# Patient Record
Sex: Female | Born: 1965 | Race: White | Hispanic: No | Marital: Married | Smoking: Never smoker
Health system: Southern US, Community
[De-identification: ages and names within clinical notes are randomized; demographics above are authoritative.]

## PROBLEM LIST (undated history)

## (undated) DIAGNOSIS — Z923 Personal history of irradiation: Secondary | ICD-10-CM

## (undated) DIAGNOSIS — Z9221 Personal history of antineoplastic chemotherapy: Secondary | ICD-10-CM

## (undated) DIAGNOSIS — J45909 Unspecified asthma, uncomplicated: Secondary | ICD-10-CM

## (undated) DIAGNOSIS — Z8489 Family history of other specified conditions: Secondary | ICD-10-CM

## (undated) DIAGNOSIS — C50919 Malignant neoplasm of unspecified site of unspecified female breast: Secondary | ICD-10-CM

## (undated) DIAGNOSIS — F32A Depression, unspecified: Secondary | ICD-10-CM

## (undated) HISTORY — DX: Unspecified asthma, uncomplicated: J45.909

## (undated) HISTORY — DX: Malignant neoplasm of unspecified site of unspecified female breast: C50.919

## (undated) HISTORY — DX: Depression, unspecified: F32.A

---

## 2004-01-18 ENCOUNTER — Other Ambulatory Visit: Admission: RE | Admit: 2004-01-18 | Discharge: 2004-01-18 | Payer: Self-pay | Admitting: Obstetrics and Gynecology

## 2004-08-31 ENCOUNTER — Encounter (INDEPENDENT_AMBULATORY_CARE_PROVIDER_SITE_OTHER): Payer: Self-pay | Admitting: *Deleted

## 2004-08-31 ENCOUNTER — Ambulatory Visit (HOSPITAL_COMMUNITY): Admission: RE | Admit: 2004-08-31 | Discharge: 2004-08-31 | Payer: Self-pay | Admitting: Obstetrics and Gynecology

## 2005-02-14 ENCOUNTER — Other Ambulatory Visit: Admission: RE | Admit: 2005-02-14 | Discharge: 2005-02-14 | Payer: Self-pay | Admitting: Obstetrics and Gynecology

## 2006-05-04 ENCOUNTER — Inpatient Hospital Stay (HOSPITAL_COMMUNITY): Admission: EM | Admit: 2006-05-04 | Discharge: 2006-05-06 | Payer: Self-pay | Admitting: *Deleted

## 2006-05-05 ENCOUNTER — Ambulatory Visit: Payer: Self-pay | Admitting: Psychiatry

## 2006-05-12 ENCOUNTER — Other Ambulatory Visit (HOSPITAL_COMMUNITY): Admission: RE | Admit: 2006-05-12 | Discharge: 2006-08-10 | Payer: Self-pay | Admitting: Psychiatry

## 2010-10-04 ENCOUNTER — Encounter
Admission: RE | Admit: 2010-10-04 | Discharge: 2010-10-04 | Payer: Self-pay | Source: Home / Self Care | Attending: Obstetrics and Gynecology | Admitting: Obstetrics and Gynecology

## 2011-03-01 NOTE — Op Note (Signed)
NAMECHINWE, LOPE   ACCOUNT NO.:  [id number]   MEDICAL RECORD NO.:  [id number]          PATIENT TYPE:  AMB   LOCATION:  SDC                           [facility]ILITY:  [facility]   PHYSICIAN:  Dineen Kid. Rana Snare, M.D.    DATE OF BIRTH:  01-04-1966   DATE OF PROCEDURE:  DATE OF DISCHARGE:                                 OPERATIVE REPORT   Audio too short to transcribe (less than 5 seconds)      DCL/MEDQ  D:  08/31/2004  T:  08/31/2004  Job:  [id number]

## 2011-03-01 NOTE — Op Note (Signed)
NAMEANNABELL, [name]   ACCOUNT NO.:  [id number]   MEDICAL RECORD NO.:  [id number]          PATIENT TYPE:  AMB   LOCATION:  SDC                           [facility]ILITY:  [facility]   PHYSICIAN:  Dineen Kid. Rana Snare, M.D.    DATE OF BIRTH:  Nov 09, 1965   DATE OF PROCEDURE:  08/31/2004  DATE OF DISCHARGE:                                 OPERATIVE REPORT   PREOPERATIVE DIAGNOSIS:  Abnormal uterine bleeding and endometrial mass on  sonohysterogram.   POSTOPERATIVE DIAGNOSIS:  Abnormal uterine bleeding and endometrial mass on  sonohysterogram.   PROCEDURE:  Hysteroscopy with dilatation and curettage.   SURGEON:  Dineen Kid. Rana Snare, M.D.   ANESTHESIA:  General by LMA.   INDICATIONS:  Ms. Jaime Pearson is a 45 year old nulligravida with abnormal  uterine bleeding.  Underwent a sonohysterogram showing irregular anterior  surface and several fibroids.  She presents for a more definitive surgical  evaluation and treatment of this.  Planned hysteroscopy with D&C, possible  resectoscope.  Risks and benefits were discussed at length, which include  but are not limited to risk of infection, bleeding; damage to uterus, tubes,  ovaries, gallbladder; the risk that this might not alleviate the bleeding,  worsen the bleeding, or further surgery needed in the future for this.  The  risks associated with anesthesia and blood loss were also discussed, and  informed consent was obtained.  Findings at the time of the surgery were a  thickened anterior endometrial lining with a question of early polyps.  Posterior endometrial lining appeared to be normal as did the ostia and the  cervix.   DESCRIPTION OF PROCEDURE:  After adequate analgesia, the patient was placed  in the dorsal lithotomy position.  She was sterilely prepped and draped.  The bladder was thoroughly drained.  A __________  speculum was placed.  Tenaculum was placed in the anterior lip of the cervix.  Uterus was sounded  to 8 cm, easily dilated to a #31  Pratt dilator.  Hysteroscope was inserted.  The above findings were noted.  Curettage was performed, retrieving a  moderate amount of endometrial tissue and clot.  This was followed by  reexamination with the hysteroscope, revealing a fairly normal-appearing  endometrial cavity.  Resectoscope was used to remove a small fragment of  endometrium from the anterior left endometrial wall.  With good hemostasis  achieved, normal-appearing endometrial cavity at this point with a normal-  appearing ostia and cervix, the hysteroscope was then removed.  Tenaculum  removed from the cervix.  Noted to be hemostatic.  The speculum was then  removed, and patient transferred to the recovery room in stable condition.  The estimated blood loss less than 10 cc.  __________ deficit 100 cubic  centimeters.  The patient received Rocephin 1 g preoperatively and 30 mg  Toradol IV postoperatively.   DISPOSITION:  The patient will be discharged home to follow up in the office  in 2 to 3 weeks and with a routine instruction sheet for D&C.  Told to  return for increased pain, fever, or bleeding.    DCL/MEDQ  D:  08/31/2004  T:  09/01/2004  Job:  [id number]

## 2011-03-01 NOTE — Discharge Summary (Signed)
Jaime Pearson, Jaime Pearson   ACCOUNT NO.:  [id number]   MEDICAL RECORD NO.:  [id number]          PATIENT TYPE:  IPS   LOCATION:  0508                          [facility]   PHYSICIAN:  Jasmine Pang, M.D. DATE OF BIRTH:  Feb 11, 1966   DATE OF ADMISSION:  05/04/2006  DATE OF DISCHARGE:  05/06/2006                                 DISCHARGE SUMMARY   IDENTIFYING INFORMATION:  A 45 year old married Caucasian female who was  admitted on a voluntary basis to my service on May 04, 2006.   HISTORY OF PRESENT ILLNESS:  The patient has a history of depression, this  has escalated and she has begun to feel overwhelmed. She feels particularly  guilty over an affair she has had with a married man. She states her husband  is now aware and they are trying to deal with this and their marriage. The  patient has been having episodes of crying, having fleeting suicidal  thoughts, no specific plan. She stated there was a lot of stress at home  right now because of the affair. She states she would have episodes of  crying to the point of being hysterical. She stated that she was especially  devastated about the end of this affair because this person had been a close  friend for many years and she has lost that as well. She is grieving this  loss.   PAST PSYCHIATRIC HISTORY:  This is the first [facility] hospitalization. She sees  Dr. Leilani Merl at [facility]. She also sees Dema Severin for therapy.   FAMILY HISTORY:  Positive for father being an alcoholic, father abusing  alcohol.   SUBSTANCE ABUSE HISTORY:  The patient is a nonsmoker. She denies alcohol or  drug use.   PAST MEDICAL HISTORY:   MEDICAL PROBLEMS:  The patient is healthy.   MEDICATIONS:  Wellbutrin XL 300 mg p.o. daily x2 weeks.   ALLERGIES:  No known drug allergies.   PHYSICAL EXAMINATION:  The patient's physical exam was done by our nurse  practitioner.  She was found to be a healthy, middle-aged female without acute physical  distress.   ADMISSION LABS:  TSH was 1.128 which was within normal limits. Urinalysis  revealed 11-20 WBCs per HPF with many bacterium. The patient possibly has a  UTI, we will look into this further. Direct bilirubin was less than 0.1.  Urine pregnancy test was negative. Comprehensive metabolic panel within  normal limits except for an elevated glucose of 107. Hepatic profile within  normal limits. CBC was within normal limits.   HOSPITAL COURSE:  Upon admission, the patient was started on Ambien 10 mg  p.o. q.h.s., may repeat x1 and Wellbutrin XL 300 mg p.o. daily. On 2007 23,  2007, the patient was started ibuprofen 400 mg now for cramping pain and  q.6h. for cramping pain. The patient tolerated her medications well with no  significant side effects. She discussed the stress at home. She felt more  under control, she was not hysterical anymore. She states that she had had  some thoughts of wanting to die but no longer had these. She did discuss the  grief over the loss of her  close friend who she had an affair with and they  had to break it off. The patient was feeling that intensive outpatient  program would be a better fit for her as she was not suicidal and homicidal  or psychotic.   On May 06, 2006, the patient's mental status had improved. She had good eye  contact, speech was normal, rate and flow. Psychomotor activity was normal.  Mood wise there was still some depression and anxiety but this was  improving. Affect wide range, smiling at times. No suicidal or homicidal  ideations. No self injurious behavior, no auditory or visual hallucinations.  No paranoia or delusions, soft logical, goal directed, thought content no  predominant theme. Cognitive is grossly intact. The patient was agreeable to  IOP for further treatment. She had talked with her husband and he felt too  that she would be safe to come home.   DISCHARGE DIAGNOSES:  Major depression, single episode, severe  without  psychosis.  AXIS II:  None.  AXIS III:  Healthy.  AXIS IV:  Severe (recent affair, problems with primary support group, other  psychosocial problems).  AXIS V:  GAF upon discharge was 48. GAF upon admission was 35. GAF high this  past year was 85.   PLAN:  There were no specific activity levels of dietary restrictions. She  was discharged on bupropion XL 300 mt daily and Ambien 10 mg at bedtime. May  repeat dose if needed.   POST HOSPITAL CARE PLANS:  The patient will attend our intensive outpatient  program. She will also continue with her counselor at [facility].      Jasmine Pang, M.D.  Electronically Signed     BHS/MEDQ  D:  05/06/2006  T:  05/06/2006  Job:  [id number]

## 2011-09-27 IMAGING — US US BREAST RIGHT
1 series · 12 of 12 positions shown · non-contrast
Comparison: Screening mammogram dated 09/12/2010.

CLINICAL DATA: Possible right breast mass at recent screening
mammography.

DIGITAL DIAGNOSTIC RIGHT MAMMOGRAM  AND RIGHT BREAST ULTRASOUND:

[Series 1: us breast right · 12 of 12 slices shown]
[im 1/12]
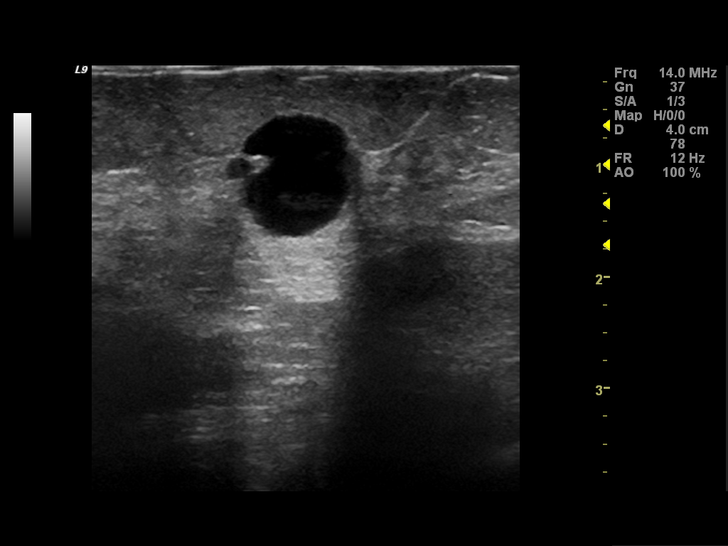
[im 2/12]
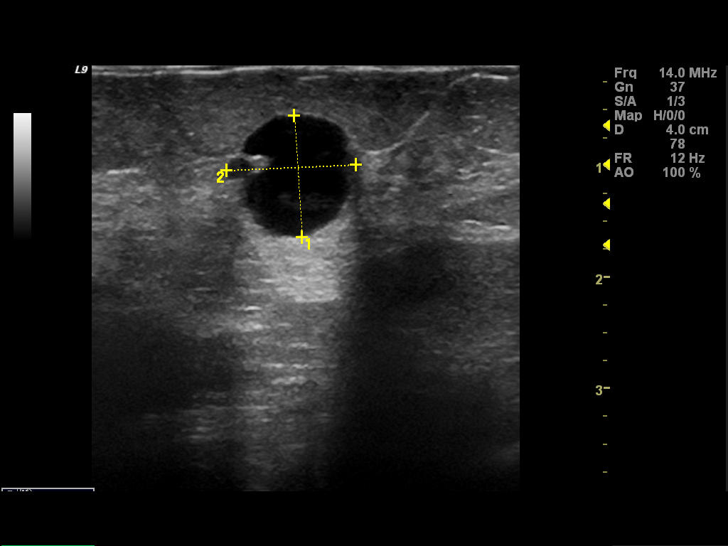
[im 3/12]
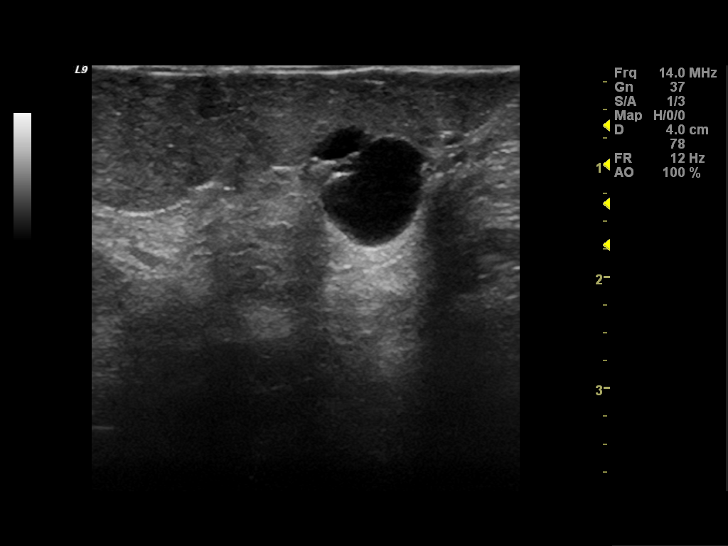
[im 4/12]
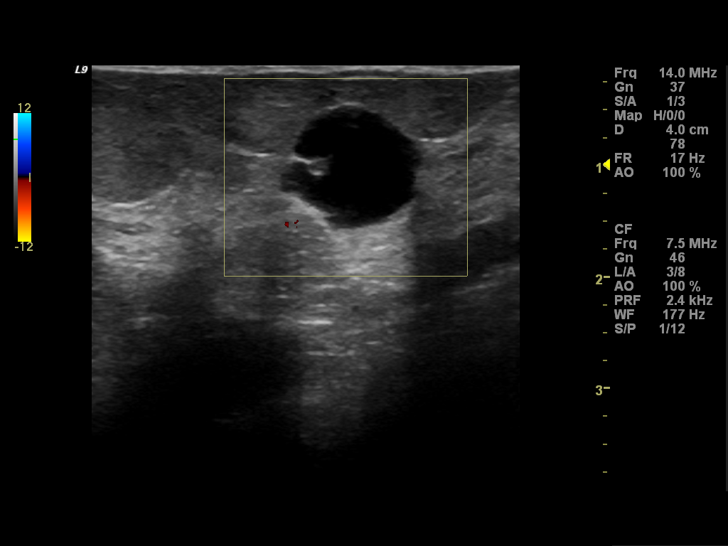
[im 5/12]
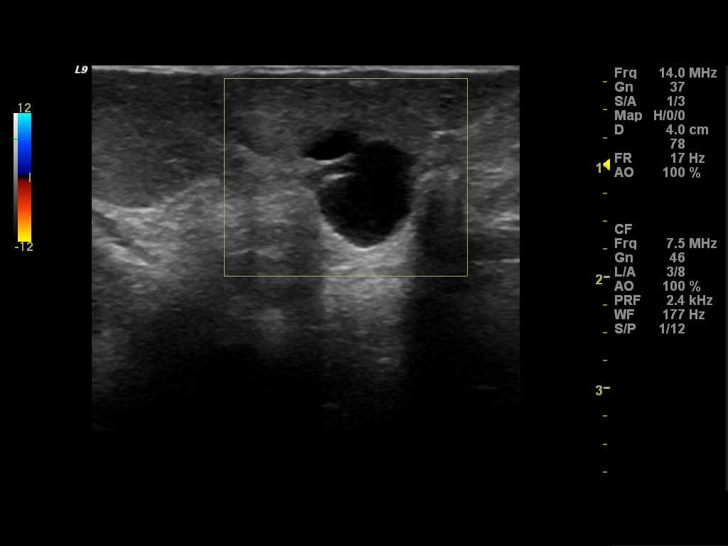
[im 6/12]
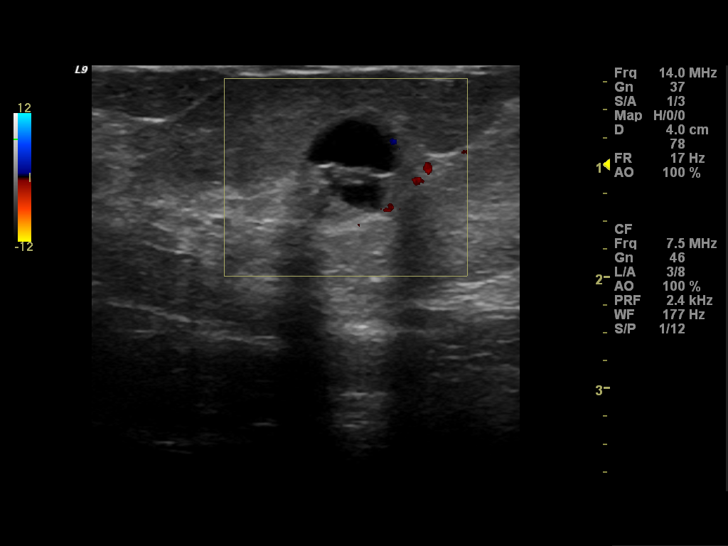
[im 7/12]
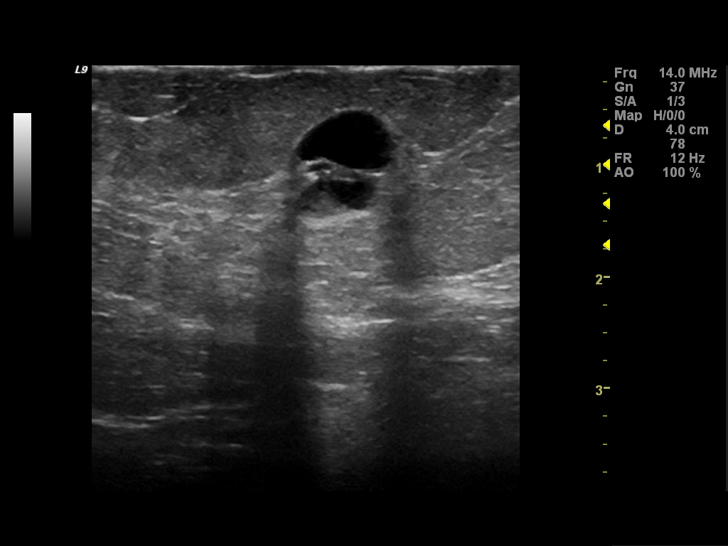
[im 8/12]
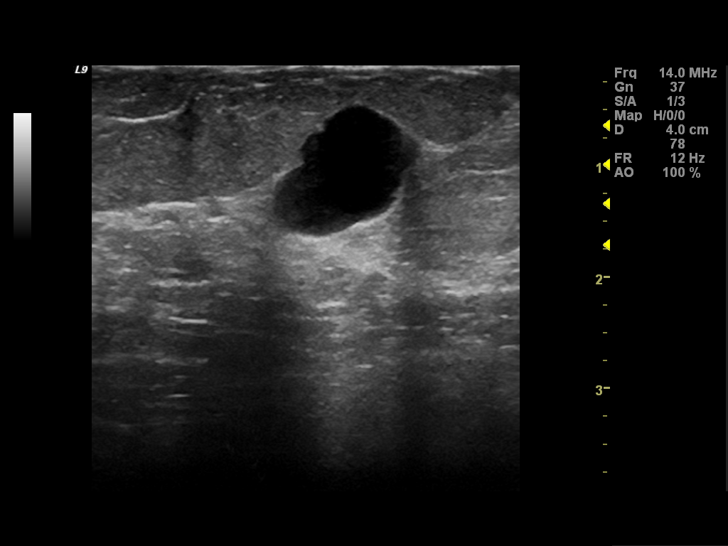
[im 9/12]
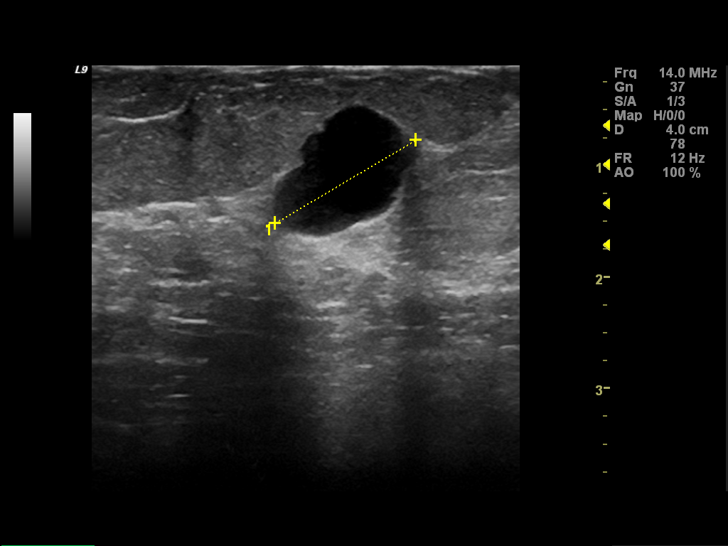
[im 10/12]
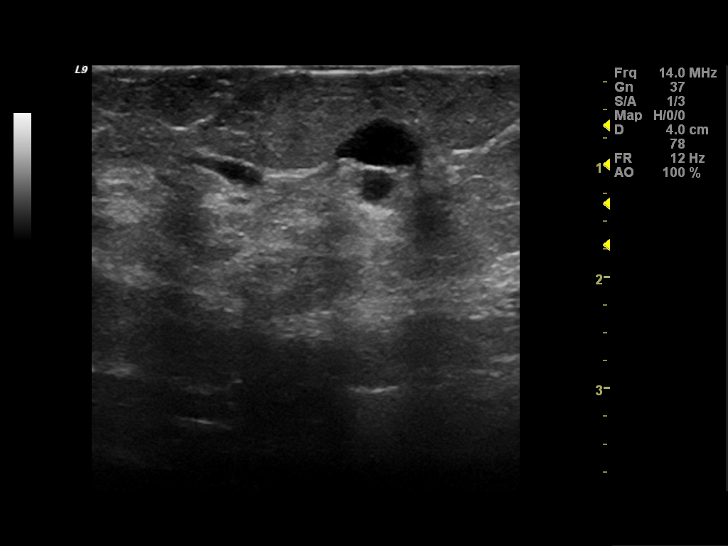
[im 11/12]
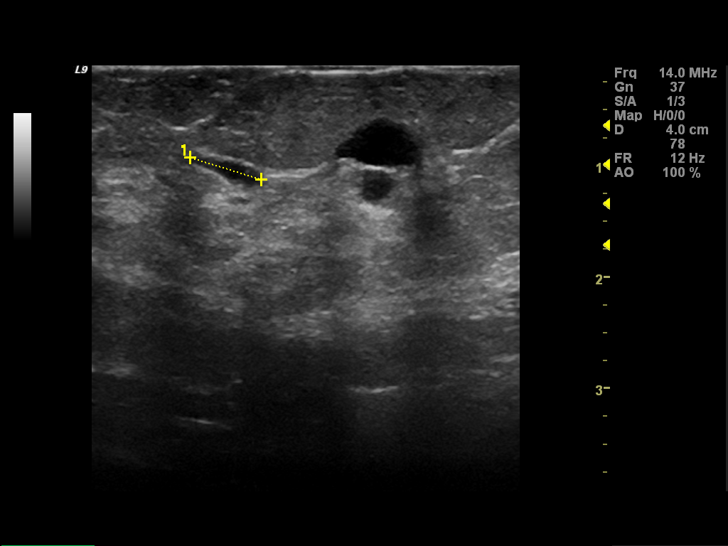
[im 12/12]
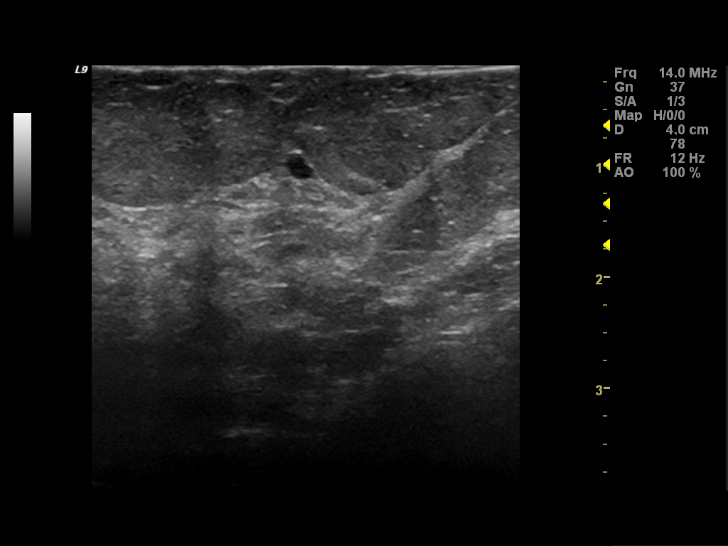

[12 of 12 positions shown; findings below may reference images not displayed]

FINDINGS: Spot compression views of the right breast confirm a
rounded mass in the lower outer portion of the breast, anteriorly.
The margins are partially obscured.  The visualized margins are
smooth.

On physical exam, no mass is palpable in the inferior right breast

Ultrasound is performed, showing a 1.5 cm mildly macrolobulated
cyst containing a small daughter cyst in the 7 o'clock position of
the right breast, 6 cm from the nipple, corresponding to the
mammographic mass.  This has a moderately thickened internal
septation without nodularity and with no internal blood flow with
color Doppler.  There is an adjacent 7 mm cyst as well.
IMPRESSION: Right breast cysts.  No evidence of malignancy.  Screening
mammography is recommended in 1 year.

BI-RADS CATEGORY 2:  Benign finding(s).

## 2015-06-05 ENCOUNTER — Other Ambulatory Visit: Payer: Self-pay | Admitting: Obstetrics and Gynecology

## 2015-06-07 LAB — CYTOLOGY - PAP

## 2022-05-02 ENCOUNTER — Other Ambulatory Visit: Payer: Self-pay | Admitting: Obstetrics and Gynecology

## 2022-05-02 DIAGNOSIS — R928 Other abnormal and inconclusive findings on diagnostic imaging of breast: Secondary | ICD-10-CM

## 2022-05-08 ENCOUNTER — Ambulatory Visit
Admission: RE | Admit: 2022-05-08 | Discharge: 2022-05-08 | Disposition: A | Discharge status: Home / Self Care | Payer: Self-pay | Source: Ambulatory Visit | Attending: Obstetrics and Gynecology | Admitting: Obstetrics and Gynecology

## 2022-05-08 ENCOUNTER — Ambulatory Visit
Admission: RE | Admit: 2022-05-08 | Discharge: 2022-05-08 | Disposition: A | Discharge status: Home / Self Care | Payer: BC Managed Care – PPO | Source: Ambulatory Visit | Attending: Obstetrics and Gynecology | Admitting: Obstetrics and Gynecology

## 2022-05-08 ENCOUNTER — Other Ambulatory Visit: Payer: Self-pay | Admitting: Obstetrics and Gynecology

## 2022-05-08 DIAGNOSIS — N6321 Unspecified lump in the left breast, upper outer quadrant: Secondary | ICD-10-CM

## 2022-05-08 DIAGNOSIS — R928 Other abnormal and inconclusive findings on diagnostic imaging of breast: Secondary | ICD-10-CM

## 2022-05-13 ENCOUNTER — Ambulatory Visit
Admission: RE | Admit: 2022-05-13 | Discharge: 2022-05-13 | Disposition: A | Discharge status: Home / Self Care | Payer: BC Managed Care – PPO | Source: Ambulatory Visit | Attending: Obstetrics and Gynecology | Admitting: Obstetrics and Gynecology

## 2022-05-13 DIAGNOSIS — N6321 Unspecified lump in the left breast, upper outer quadrant: Secondary | ICD-10-CM

## 2022-05-16 ENCOUNTER — Telehealth: Payer: Self-pay | Admitting: Hematology and Oncology

## 2022-05-16 NOTE — Telephone Encounter (Signed)
Spoke to patient to confirm afternoon clinic appointment for 8/9, sent paperwork via e-mail

## 2022-05-17 ENCOUNTER — Encounter: Payer: Self-pay | Admitting: *Deleted

## 2022-05-17 DIAGNOSIS — C50412 Malignant neoplasm of upper-outer quadrant of left female breast: Secondary | ICD-10-CM | POA: Insufficient documentation

## 2022-05-17 DIAGNOSIS — Z170 Estrogen receptor positive status [ER+]: Secondary | ICD-10-CM

## 2022-05-21 NOTE — Progress Notes (Signed)
Radiation Oncology         [phone] ________________________________  Multidisciplinary Breast Oncology Clinic [facility]) Initial Outpatient Consultation  Name: Jaime Pearson MRN: [id number]  Date: 05/22/2022  DOB: 1966/03/24  CC:No primary care provider on file.  Stark Klein, MD   REFERRING PHYSICIAN: Stark Klein, MD  DIAGNOSIS: There were no encounter diagnoses.  Stage *** Left Breast UOQ, Invasive Ductal Carcinoma, ER+ / PR+ / Her2***, Grade 3  No diagnosis found.  HISTORY OF PRESENT ILLNESS::Jaime Pearson is a 56 y.o. female who is presenting to the office today for evaluation of her newly diagnosed breast cancer. She is accompanied by ***. She is doing well overall.   She had routine screening mammography on 04/30/22 showing a possible abnormality in the left breast. She underwent left breast diagnostic mammography with tomography and left breast ultrasonography at [facility] on 05/08/22 showing: a highly suspicious mass in the 1 o'clock left breast, 7 cmfn, measuring 2.8 x 1.8 x 2.9 cm. No axillary adenopathy was appreciated.   Biopsy of the left breast mass on 05/13/22 showed: grade 3 invasive ductal carcinoma measuring 1.3 cm in the greatest linear extent . Prognostic indicators significant for: estrogen receptor, 95% positive with strong staining intensity and progesterone receptor, 20% positive with moderate staining intensity. Proliferation marker Ki67 at 40%. HER2 {positive or negative}.  Menarche: *** years old Age at first live birth: *** years old GP: *** LMP: *** Contraceptive: *** HRT: ***   The patient was referred today for presentation in the multidisciplinary conference.  Radiology studies and pathology slides were presented there for review and discussion of treatment options.  A consensus was discussed regarding potential next steps.  PREVIOUS RADIATION THERAPY: {EXAM; YES/NO:19492::"No"}  PAST MEDICAL HISTORY: No past  medical history on file.  PAST SURGICAL HISTORY:*** The histories are not reviewed yet. Please review them in the "History" navigator section and refresh this [place].  FAMILY HISTORY:  Family History  Problem Relation Age of Onset   Breast cancer Sister 57    SOCIAL HISTORY:  Social History   Socioeconomic History   Marital status: Married    Spouse name: Not on file   Number of children: Not on file   Years of education: Not on file   Highest education level: Not on file  Occupational History   Not on file  Tobacco Use   Smoking status: Not on file   Smokeless tobacco: Not on file  Substance and Sexual Activity   Alcohol use: Not on file   Drug use: Not on file   Sexual activity: Not on file  Other Topics Concern   Not on file  Social History Narrative   Not on file   Social Determinants of Health   Financial Resource Strain: Not on file  Food Insecurity: Not on file  Transportation Needs: Not on file  Physical Activity: Not on file  Stress: Not on file  Social Connections: Not on file    ALLERGIES: Not on File  MEDICATIONS:  No current outpatient medications on file.   No current facility-administered medications for this encounter.    REVIEW OF SYSTEMS: A 10+ POINT REVIEW OF SYSTEMS WAS OBTAINED including neurology, dermatology, psychiatry, cardiac, respiratory, lymph, extremities, GI, GU, musculoskeletal, constitutional, reproductive, HEENT. On the provided form, she reports ***. She denies *** and any other symptoms.    PHYSICAL EXAM:  vitals were not taken for this visit.  {may need to copy over vitals} Lungs are clear to auscultation bilaterally.  Heart has regular rate and rhythm. No palpable cervical, supraclavicular, or axillary adenopathy. Abdomen soft, non-tender, normal bowel sounds. Breast: *** breast with no palpable mass, nipple discharge, or bleeding. *** breast with ***.   KPS = ***  100 - Normal; no complaints; no evidence of  disease. 90   - Able to carry on normal activity; minor signs or symptoms of disease. 80   - Normal activity with effort; some signs or symptoms of disease. 70   - Cares for self; unable to carry on normal activity or to do active work. 60   - Requires occasional assistance, but is able to care for most of his personal needs. 50   - Requires considerable assistance and frequent medical care. 8   - Disabled; requires special care and assistance. 64   - Severely disabled; hospital admission is indicated although death not imminent. 53   - Very sick; hospital admission necessary; active supportive treatment necessary. 10   - Moribund; fatal processes progressing rapidly. 0     - Dead  Karnofsky DA, Abelmann WH, Craver LS and Burchenal JH [phone]) The use of the nitrogen mustards in the palliative treatment of carcinoma: with particular reference to bronchogenic carcinoma Cancer 1 [phone]  LABORATORY DATA:  No results found for: "WBC", "HGB", "HCT", "MCV", "PLT" No results found for: "NA", "K", "CL", "CO2" No results found for: "ALT", "AST", "GGT", "ALKPHOS", "BILITOT"  PULMONARY FUNCTION TEST:   Review Flowsheet        No data to display          RADIOGRAPHY: [place] LT BREAST BX W LOC DEV 1ST LESION IMG BX SPEC US GUIDE  Addendum Date: 05/16/2022   ADDENDUM REPORT: 05/16/2022 13:24 ADDENDUM: Pathology revealed GRADE III INVASIVE DUCTAL CARCINOMA of the LEFT breast, 1:00 o'clock, (ribbon clip). This was found to be concordant by Dr. Claudie Revering. Pathology results were discussed with the patient and her husband by telephone. The patient reported doing well after the biopsy with tenderness at the site. Post biopsy instructions and care were reviewed and questions were answered. The patient was encouraged to call [facility] for any additional concerns. My direct phone number was provided. The patient was referred to [facility] at  [facility] on May 22, 2022. Pathology results reported by Terie Purser, RN on 05/16/2022. Electronically Signed   By: Claudie Revering M.D.   On: 05/16/2022 13:24   Result Date: 05/16/2022 CLINICAL DATA:  2.9 cm mass in the 1 o'clock position of the left breast with recent mammogram and ultrasound features highly suspicious for malignancy. EXAM: ULTRASOUND GUIDED LEFT BREAST CORE NEEDLE BIOPSY COMPARISON:  Previous exam(s). PROCEDURE: I met with the patient and we discussed the procedure of ultrasound-guided biopsy, including benefits and alternatives. We discussed the high likelihood of a successful procedure. We discussed the risks of the procedure, including infection, bleeding, tissue injury, clip migration, and inadequate sampling. Informed written consent was given. The usual time-out protocol was performed immediately prior to the procedure. Lesion quadrant: Upper outer quadrant Using sterile technique and 1% Lidocaine as local anesthetic, under direct ultrasound visualization, a 12 gauge spring-loaded device was used to perform biopsy of the recently demonstrated 2.9 cm mass in the 1 o'clock position of the left breast, 7 cm from the, using an inferolateral approach. At the conclusion of the procedure a ribbon shaped tissue marker clip was deployed into the biopsy cavity. Follow up 2 view  mammogram was performed and dictated separately. IMPRESSION: Ultrasound guided biopsy of a 2.9 cm mass in the 1 o'clock position of the left breast. No apparent complications. Electronically Signed: By: Claudie Revering M.D. On: 05/13/2022 14:50  MM CLIP PLACEMENT LEFT  Result Date: 05/13/2022 CLINICAL DATA:  Status post ultrasound-guided core needle biopsy of a 2.9 cm mass in the 1 o'clock position of the left breast EXAM: 3D DIAGNOSTIC LEFT MAMMOGRAM POST ULTRASOUND BIOPSY COMPARISON:  Previous exam(s). FINDINGS: 3D Mammographic images were obtained following ultrasound guided biopsy of recently  demonstrated 2.9 cm mass in the 1 o'clock position of the left breast. The biopsy marking clip is in the biopsied mass. IMPRESSION: Appropriate positioning of the ribbon shaped biopsy marking clip at the site of biopsy in the biopsied mass in the 1 o'clock position of the left breast. Final Assessment: Post Procedure Mammograms for Marker Placement Electronically Signed   By: Claudie Revering M.D.   On: 05/13/2022 15:02  MM DIAG BREAST TOMO UNI LEFT  Result Date: 05/08/2022 CLINICAL DATA:  The patient was called back for a left breast mass. EXAM: DIGITAL DIAGNOSTIC UNILATERAL LEFT MAMMOGRAM WITH TOMOSYNTHESIS; ULTRASOUND LEFT BREAST LIMITED TECHNIQUE: Left digital diagnostic mammography and breast tomosynthesis was performed.; Targeted ultrasound examination of the left breast was performed. COMPARISON:  Previous exam(s). ACR Breast Density Category c: The breast tissue is heterogeneously dense, which may obscure small masses. FINDINGS: There is a suspicious mass in the slightly lateral superior left breast with associated spiculation. Targeted ultrasound is performed, showing an irregular mass containing calcifications in the left breast at 1 o'clock, 7 cm from the nipple measuring 2.8 x 1.8 x 2.9 cm. No axillary adenopathy. IMPRESSION: Highly suspicious left breast mass.  No adenopathy. RECOMMENDATION: Recommend ultrasound-guided biopsy of the left breast mass. I have discussed the findings and recommendations with the patient. If applicable, a reminder letter will be sent to the patient regarding the next appointment. BI-RADS CATEGORY  5: Highly suggestive of malignancy. Electronically Signed   By: Dorise Bullion III M.D.   On: 05/08/2022 11:49  US BREAST LTD UNI LEFT INC AXILLA  Result Date: 05/08/2022 CLINICAL DATA:  The patient was called back for a left breast mass. EXAM: DIGITAL DIAGNOSTIC UNILATERAL LEFT MAMMOGRAM WITH TOMOSYNTHESIS; ULTRASOUND LEFT BREAST LIMITED TECHNIQUE: Left digital diagnostic  mammography and breast tomosynthesis was performed.; Targeted ultrasound examination of the left breast was performed. COMPARISON:  Previous exam(s). ACR Breast Density Category c: The breast tissue is heterogeneously dense, which may obscure small masses. FINDINGS: There is a suspicious mass in the slightly lateral superior left breast with associated spiculation. Targeted ultrasound is performed, showing an irregular mass containing calcifications in the left breast at 1 o'clock, 7 cm from the nipple measuring 2.8 x 1.8 x 2.9 cm. No axillary adenopathy. IMPRESSION: Highly suspicious left breast mass.  No adenopathy. RECOMMENDATION: Recommend ultrasound-guided biopsy of the left breast mass. I have discussed the findings and recommendations with the patient. If applicable, a reminder letter will be sent to the patient regarding the next appointment. BI-RADS CATEGORY  5: Highly suggestive of malignancy. Electronically Signed   By: Dorise Bullion III M.D.   On: 05/08/2022 11:49     IMPRESSION: ***   Patient will be a good candidate for breast conservation with radiotherapy to the left breast. We discussed the general course of radiation, potential side effects, and toxicities with radiation and the patient is interested in this approach. ***   PLAN:  ***    ------------------------------------------------  Blair Promise, PhD, MD  This document serves as a record of services personally performed by Gery Pray, MD. It was created on his behalf by Roney Mans, a trained medical scribe. The creation of this record is based on the scribe's personal observations and the provider's statements to them. This document has been checked and approved by the attending provider.

## 2022-05-22 ENCOUNTER — Encounter: Payer: Self-pay | Admitting: General Practice

## 2022-05-22 ENCOUNTER — Encounter: Payer: Self-pay | Admitting: Emergency Medicine

## 2022-05-22 ENCOUNTER — Other Ambulatory Visit: Payer: Self-pay | Admitting: General Surgery

## 2022-05-22 ENCOUNTER — Ambulatory Visit
Admission: RE | Admit: 2022-05-22 | Discharge: 2022-05-22 | Disposition: A | Discharge status: Home / Self Care | Payer: BC Managed Care – PPO | Source: Ambulatory Visit | Attending: Radiation Oncology | Admitting: Radiation Oncology

## 2022-05-22 ENCOUNTER — Other Ambulatory Visit: Payer: Self-pay

## 2022-05-22 ENCOUNTER — Inpatient Hospital Stay: Payer: BC Managed Care – PPO

## 2022-05-22 ENCOUNTER — Encounter: Payer: Self-pay | Admitting: Physical Therapy

## 2022-05-22 ENCOUNTER — Ambulatory Visit: Payer: BC Managed Care – PPO | Attending: General Surgery | Admitting: Physical Therapy

## 2022-05-22 ENCOUNTER — Inpatient Hospital Stay: Payer: BC Managed Care – PPO | Attending: Hematology and Oncology | Admitting: Hematology and Oncology

## 2022-05-22 ENCOUNTER — Encounter: Payer: Self-pay | Admitting: *Deleted

## 2022-05-22 ENCOUNTER — Encounter: Payer: Self-pay | Admitting: Hematology and Oncology

## 2022-05-22 ENCOUNTER — Inpatient Hospital Stay (HOSPITAL_BASED_OUTPATIENT_CLINIC_OR_DEPARTMENT_OTHER): Payer: BC Managed Care – PPO | Admitting: Genetic Counselor

## 2022-05-22 DIAGNOSIS — R293 Abnormal posture: Secondary | ICD-10-CM | POA: Diagnosis present

## 2022-05-22 DIAGNOSIS — C50412 Malignant neoplasm of upper-outer quadrant of left female breast: Secondary | ICD-10-CM

## 2022-05-22 DIAGNOSIS — Z8042 Family history of malignant neoplasm of prostate: Secondary | ICD-10-CM

## 2022-05-22 DIAGNOSIS — Z803 Family history of malignant neoplasm of breast: Secondary | ICD-10-CM

## 2022-05-22 DIAGNOSIS — Z170 Estrogen receptor positive status [ER+]: Secondary | ICD-10-CM | POA: Insufficient documentation

## 2022-05-22 LAB — CMP (CANCER CENTER ONLY)
ALT: 13 U/L (ref 0–44)
AST: 15 U/L (ref 15–41)
Albumin: 4.3 g/dL (ref 3.5–5.0)
Alkaline Phosphatase: 88 U/L (ref 38–126)
Anion gap: 4 — ABNORMAL LOW (ref 5–15)
BUN: 12 mg/dL (ref 6–20)
CO2: 30 mmol/L (ref 22–32)
Calcium: 9.9 mg/dL (ref 8.9–10.3)
Chloride: 103 mmol/L (ref 98–111)
Creatinine: 0.61 mg/dL (ref 0.44–1.00)
GFR, Estimated: >60 mL/min (ref >60)
Glucose, Bld: 96 mg/dL (ref 70–99)
Potassium: 4.1 mmol/L (ref 3.5–5.1)
Sodium: 137 mmol/L (ref 135–145)
Total Bilirubin: 0.6 mg/dL (ref 0.3–1.2)
Total Protein: 7.2 g/dL (ref 6.5–8.1)

## 2022-05-22 LAB — CBC WITH DIFFERENTIAL (CANCER CENTER ONLY)
Abs Immature Granulocytes: 0.02 10*3/uL (ref 0.00–0.07)
Basophils Absolute: 0 10*3/uL (ref 0.0–0.1)
Basophils Relative: 0 %
Eosinophils Absolute: 0.1 10*3/uL (ref 0.0–0.5)
Eosinophils Relative: 1 %
HCT: 40.4 % (ref 36.0–46.0)
Hemoglobin: 13.6 g/dL (ref 12.0–15.0)
Immature Granulocytes: 0 %
Lymphocytes Relative: 23 %
Lymphs Abs: 1.2 10*3/uL (ref 0.7–4.0)
MCH: 30.9 pg (ref 26.0–34.0)
MCHC: 33.7 g/dL (ref 30.0–36.0)
MCV: 91.8 fL (ref 80.0–100.0)
Monocytes Absolute: 0.6 10*3/uL (ref 0.1–1.0)
Monocytes Relative: 11 %
Neutro Abs: 3.3 10*3/uL (ref 1.7–7.7)
Neutrophils Relative %: 65 %
Platelet Count: 210 10*3/uL (ref 150–400)
RBC: 4.4 MIL/uL (ref 3.87–5.11)
RDW: 13.2 % (ref 11.5–15.5)
WBC Count: 5.1 10*3/uL (ref 4.0–10.5)
nRBC: 0 % (ref 0.0–0.2)

## 2022-05-22 LAB — GENETIC SCREENING ORDER

## 2022-05-22 NOTE — Research (Signed)
[employer] 2021-05 - Specimen Collection Study to Evaluate Biomarkers in Subjects with Cancer   Patient Jaime Pearson was identified by Clinical Research Nurse as a potential candidate for the above listed study.  This Clinical Research Nurse met with Shakela Donati, [id number], on 05/22/22 in a manner and location that ensures patient privacy to discuss participation in the above listed research study.  Patient is Accompanied by spouse Gwyndolyn Saxon.  A copy of the informed consent document with embedded HIPAA language was provided to the patient.  Patient reads, speaks, and understands [place].   Patient was provided with the business card of this Nurse and encouraged to contact the research team with any questions.  Approximately 15 minutes were spent with the patient reviewing the informed consent documents.  Patient was provided the option of taking informed consent documents home to review and was encouraged to review at their convenience with their support network, including other care providers. Patient took the consent documents home to review.  Will f/u with patient in a few days to determine interest.  Wells Guiles 'Donell Sievert, RN, BSN Clinical Research Nurse I 05/22/22 3:39 PM

## 2022-05-22 NOTE — Progress Notes (Signed)
[facility] Psychosocial Distress Screening Spiritual Care  Met with Jaime Pearson and her husband in [facility] to introduce [facility] team/resources, reviewing distress screen per protocol.  The patient scored a 1 on the Psychosocial Distress Thermometer which indicates mild distress. Also assessed for distress and other psychosocial needs.    05/22/2022  ONCBCN DISTRESS SCREENING   Screening Type Initial Screening   Distress experienced in past week (1-10) 1   Emotional problem type Nervousness/Anxiety   Referral to support programs Yes    Jaime Pearson reports low distress overall and strong coping tools (humor, faith, perspective, mindful interventions, etc) that she has developed through a history of navigating depression. Couple reports good support from their [employer] in [place] and expressed gratitude for the [facility] format and information.  Follow up needed: Yes.  Jaime Pearson welcomes a follow-up call in ca two weeks to process updates in her care and coping.   [place], [place], [facility] Pager [phone] Voicemail [phone]

## 2022-05-22 NOTE — Assessment & Plan Note (Signed)
05/13/2022:Screening mammogram detected left breast mass at 1 o'clock position measured 2.9 cm.  Axilla negative.  Biopsy revealed grade 3 IDC ER 95% PR 20% HER2 negative Ki-67 40%.  Pathology and radiology counseling:Discussed with the patient, the details of pathology including the type of breast cancer,the clinical staging, the significance of ER, PR and HER-2/neu receptors and the implications for treatment. After reviewing the pathology in detail, we proceeded to discuss the different treatment options between surgery, radiation, chemotherapy, antiestrogen therapies.  Recommendations: 1. Breast conserving surgery followed by 2. Oncotype DX testing to determine if chemotherapy would be of any benefit followed by 3. Adjuvant radiation therapy followed by 4. Adjuvant antiestrogen therapy  Oncotype counseling: I discussed Oncotype DX test. I explained to the patient that this is a 21 gene panel to evaluate patient tumors DNA to calculate recurrence score. This would help determine whether patient has high risk or low risk breast cancer. She understands that if her tumor was found to be high risk, she would benefit from systemic chemotherapy. If low risk, no need of chemotherapy.  Return to clinic after surgery to discuss final pathology report and then determine if Oncotype DX testing will need to be sent.

## 2022-05-22 NOTE — Therapy (Signed)
OUTPATIENT PHYSICAL THERAPY BREAST CANCER BASELINE EVALUATION   Patient Name: Jaime Pearson MRN: [id number] DOB:1966-08-20, 56 y.o., female Today's Date: 05/22/2022   PT End of Session - 05/22/22 1409     Visit Number 1    Number of Visits 2    Date for PT Re-Evaluation 07/17/22    PT Start Time 4098    PT Stop Time 1352    PT Time Calculation (min) 33 min    Activity Tolerance Patient tolerated treatment well    Behavior During Therapy [facility] for tasks assessed/performed             History reviewed. No pertinent past medical history. History reviewed. No pertinent surgical history. Patient Active Problem List   Diagnosis Date Noted   Malignant neoplasm of upper-outer quadrant of left breast in female, estrogen receptor positive ([place]) 05/17/2022    REFERRING PROVIDER: Dr. Stark Klein  REFERRING DIAG: Left breast cancer  THERAPY DIAG:  Malignant neoplasm of upper-outer quadrant of left breast in female, estrogen receptor positive ([place])  Abnormal posture  Rationale for Evaluation and Treatment Rehabilitation  ONSET DATE: 04/30/2022  SUBJECTIVE                                                                                                                                                                                           SUBJECTIVE STATEMENT: Patient reports she is here today to be seen by her medical team for her newly diagnosed left breast cancer.   PERTINENT HISTORY:  Patient was diagnosed on 04/30/2022 with left grade 3 invasive ductal carcinoma breast cancer. It measures 2.9 cm and is located in the upper outer quadrant. It is ER/PR positive and HER2 negative with a Ki67 of 40%.   PATIENT GOALS   reduce lymphedema risk and learn post op HEP.   PAIN:  Are you having pain? No   PRECAUTIONS: Active CA None  HAND DOMINANCE: right  WEIGHT BEARING RESTRICTIONS No  FALLS:  Has patient fallen in last 6 months? No  LIVING ENVIRONMENT: Patient  lives with: her husband Lives in: House/apartment Has following equipment at home: None  OCCUPATION: Full time admin work  LEISURE: She walks about 8000 steps per day and for 20 consecutive minutes each day  PRIOR LEVEL OF FUNCTION: Independent   OBJECTIVE  COGNITION:  Overall cognitive status: Within functional limits for tasks assessed    POSTURE:  Forward head and rounded shoulders posture  UPPER EXTREMITY AROM/PROM:  A/PROM RIGHT   eval   Shoulder extension 50  Shoulder flexion 165  Shoulder abduction 171  Shoulder internal rotation 67  Shoulder external rotation 90    (  Blank rows = not tested)  A/PROM LEFT   eval  Shoulder extension 43  Shoulder flexion 148  Shoulder abduction 167  Shoulder internal rotation 58  Shoulder external rotation 90    (Blank rows = not tested)   CERVICAL AROM: All within normal limits  UPPER EXTREMITY STRENGTH: WNL   LYMPHEDEMA ASSESSMENTS:   LANDMARK RIGHT   eval  10 cm proximal to olecranon process 25.5  Olecranon process 23.5  10 cm proximal to ulnar styloid process 21.4  Just proximal to ulnar styloid process 14.5  Across hand at thumb web space 16.3  At base of 2nd digit 5.7  (Blank rows = not tested)  LANDMARK LEFT   eval  10 cm proximal to olecranon process 25.1  Olecranon process 22.7  10 cm proximal to ulnar styloid process 20.3  Just proximal to ulnar styloid process 13.7  Across hand at thumb web space 15.7  At base of 2nd digit 5.6  (Blank rows = not tested)   L-DEX LYMPHEDEMA SCREENING:  The patient was assessed using the L-Dex machine today to produce a lymphedema index baseline score. The patient will be reassessed on a regular basis (typically every 3 months) to obtain new L-Dex scores. If the score is > 6.5 points away from his/her baseline score indicating onset of subclinical lymphedema, it will be recommended to wear a compression garment for 4 weeks, 12 hours per day and then be reassessed. If  the score continues to be > 6.5 points from baseline at reassessment, we will initiate lymphedema treatment. Assessing in this manner has a 95% rate of preventing clinically significant lymphedema.   L-DEX FLOWSHEETS - 05/22/22 1400       L-DEX LYMPHEDEMA SCREENING   Measurement Type Unilateral    L-DEX MEASUREMENT EXTREMITY Upper Extremity    POSITION  Standing    DOMINANT SIDE Right    At Risk Side Left    BASELINE SCORE (UNILATERAL) -10.8              PATIENT EDUCATION:  Education details: Lymphedema risk reduction and post op shoulder/posture HEP Person educated: Patient Education method: Explanation, Demonstration, Handout Education comprehension: Patient verbalized understanding and returned demonstration   HOME EXERCISE PROGRAM: Patient was instructed today in a home exercise program today for post op shoulder range of motion. These included active assist shoulder flexion in sitting, scapular retraction, wall walking with shoulder abduction, and hands behind head external rotation.  She was encouraged to do these twice a day, holding 3 seconds and repeating 5 times when permitted by her physician.   ASSESSMENT:  CLINICAL IMPRESSION: Patient was diagnosed on 04/30/2022 with left grade 3 invasive ductal carcinoma breast cancer. It measures 2.9 cm and is located in the upper outer quadrant. It is ER/PR positive and HER2 negative with a Ki67 of 40%. Her multidisciplinary medical team met prior to her assessments to determine a recommended treatment plan. She is planning to have a left lumpectomy and sentinel node biopsy followed by oncotype testing, radiation, and anti-estrogen therapy. She will benefit from a post op PT reassessment to determine needs and from L-Dex screens every 3 months for 2 years to detect subclinical lymphedema.  Pt will benefit from skilled therapeutic intervention to improve on the following deficits: Decreased knowledge of precautions, impaired UE  functional use, pain, decreased ROM, postural dysfunction.   PT treatment/interventions: ADL/self-care home management, pt/family education, therapeutic exercise  REHAB POTENTIAL: Excellent  CLINICAL DECISION MAKING: Stable/uncomplicated  EVALUATION COMPLEXITY: Low  GOALS: Goals reviewed with patient? YES  LONG TERM GOALS: (STG=LTG)    Name Target Date Goal status  1 Pt will be able to verbalize understanding of pertinent lymphedema risk reduction practices relevant to her dx specifically related to skin care.  Baseline:  No knowledge 05/22/2022 Achieved at eval  2 Pt will be able to return demo and/or verbalize understanding of the post op HEP related to regaining shoulder ROM. Baseline:  No knowledge 05/22/2022 Achieved at eval  3 Pt will be able to verbalize understanding of the importance of attending the post op After Breast CA Class for further lymphedema risk reduction education and therapeutic exercise.  Baseline:  No knowledge 05/22/2022 Achieved at eval  4 Pt will demo she has regained full shoulder ROM and function post operatively compared to baselines.  Baseline: See objective measurements taken today. 07/17/2022      PLAN: PT FREQUENCY/DURATION: EVAL and 1 follow up appointment.   PLAN FOR NEXT SESSION: will reassess 3-4 weeks post op to determine needs.   Patient will follow up at outpatient cancer rehab 3-4 weeks following surgery.  If the patient requires physical therapy at that time, a specific plan will be dictated and sent to the referring physician for approval. The patient was educated today on appropriate basic range of motion exercises to begin post operatively and the importance of attending the After Breast Cancer class following surgery.  Patient was educated today on lymphedema risk reduction practices as it pertains to recommendations that will benefit the patient immediately following surgery.  She verbalized good understanding.    Physical Therapy  Information for After Breast Cancer Surgery/Treatment:  Lymphedema is a swelling condition that you may be at risk for in your arm if you have lymph nodes removed from the armpit area.  After a sentinel node biopsy, the risk is approximately 5-9% and is higher after an axillary node dissection.  There is treatment available for this condition and it is not life-threatening.  Contact your physician or physical therapist with concerns. You may begin the 4 shoulder/posture exercises (see additional sheet) when permitted by your physician (typically a week after surgery).  If you have drains, you may need to wait until those are removed before beginning range of motion exercises.  A general recommendation is to not lift your arms above shoulder height until drains are removed.  These exercises should be done to your tolerance and gently.  This is not a "no pain/no gain" type of recovery so listen to your body and stretch into the range of motion that you can tolerate, stopping if you have pain.  If you are having immediate reconstruction, ask your plastic surgeon about doing exercises as he or she may want you to wait. We encourage you to attend the free one time ABC (After Breast Cancer) class offered by [place].  You will learn information related to lymphedema risk, prevention and treatment and additional exercises to regain mobility following surgery.  You can call [phone] for more information.  This is offered the 1st and 3rd Monday of each month.  You only attend the class one time. While undergoing any medical procedure or treatment, try to avoid blood pressure being taken or needle sticks from occurring on the arm on the side of cancer.   This recommendation begins after surgery and continues for the rest of your life.  This may help reduce your risk of getting lymphedema (swelling in your arm). An excellent resource  for those seeking information on lymphedema is the  National Lymphedema Network's web site. It can be accessed at [facility].org If you notice swelling in your hand, arm or breast at any time following surgery (even if it is many years from now), please contact your doctor or physical therapist to discuss this.  Lymphedema can be treated at any time but it is easier for you if it is treated early on.  If you feel like your shoulder motion is not returning to normal in a reasonable amount of time, please contact your surgeon or physical therapist.  [facility] [phone]. [address], [place] [address]  ABC CLASS After Breast Cancer Class  After Breast Cancer Class is a specially designed exercise class to assist you in a safe recover after having breast cancer surgery.  In this class you will learn how to get back to full function whether your drains were just removed or if you had surgery a month ago.  This one-time class is held the 1st and 3rd Monday of every month from 11:00 a.m. until 12:00 noon virtually.  This class is FREE and space is limited. For more information or to register for the next available class, call [phone].  Class Goals  Understand specific stretches to improve the flexibility of you chest and shoulder. Learn ways to safely strengthen your upper body and improve your posture. Understand the warning signs of infection and why you may be at risk for an arm infection. Learn about Lymphedema and prevention.  ** You do not attend this class until after surgery.  Drains must be removed to participate  Patient was instructed today in a home exercise program today for post op shoulder range of motion. These included active assist shoulder flexion in sitting, scapular retraction, wall walking with shoulder abduction, and hands behind head external rotation.  She was encouraged to do these twice a day, holding 3 seconds and repeating 5 times when permitted by her  physician.    Kaelyn Innocent,MARTI COOPER, PT 05/22/2022, 2:10 PM

## 2022-05-22 NOTE — Progress Notes (Signed)
[facility] NOTE  Patient Care Team: Louretta Shorten, MD as PCP - General (Obstetrics and Gynecology) Rockwell Germany, RN as Oncology Nurse Navigator Tressie Ellis, Paulette Blanch, RN as Oncology Nurse Navigator Stark Klein, MD as Consulting Physician (General Surgery) Nicholas Lose, MD as Consulting Physician (Hematology and Oncology) Gery Pray, MD as Consulting Physician (Radiation Oncology)  CHIEF COMPLAINTS/PURPOSE OF CONSULTATION:  Newly diagnosed breast cancer  HISTORY OF PRESENTING ILLNESS:  Jaime Pearson 56 y.o. female is here because of recent diagnosis of left breast cancer.  She had routine screening mammogram that detected left breast mass 1 o'clock position measuring 2.9 cm.  Axilla is negative.  Biopsy of the mass revealed grade 3 invasive ductal carcinoma that was ER/PR positive HER2 negative with a Ki-67 of 40%.  She was presented this morning to the multidisciplinary tumor board and she is here accompanied by her husband to discuss her treatment plan.  She lives in [place] and works at [employer] in [employer].  I reviewed her records extensively and collaborated the history with the patient.  SUMMARY OF ONCOLOGIC HISTORY: Oncology History  Malignant neoplasm of upper-outer quadrant of left breast in female, estrogen receptor positive (Jaime Pearson)  05/13/2022 Initial Diagnosis   Screening mammogram detected left breast mass at 1 o'clock position measured 2.9 cm.  Axilla negative.  Biopsy revealed grade 3 IDC ER 95% PR 20% HER2 negative Ki-67 40%.      MEDICAL HISTORY:  Past Medical History:  Diagnosis Date   Breast cancer (Jaime Pearson)     SURGICAL HISTORY: History reviewed. No pertinent surgical history.  SOCIAL HISTORY: Social History   Socioeconomic History   Marital status: Married    Spouse name: Not on file   Number of children: Not on file   Years of education: Not on file   Highest education level: Not on file   Occupational History   Not on file  Tobacco Use   Smoking status: Not on file   Smokeless tobacco: Not on file  Substance and Sexual Activity   Alcohol use: Not on file   Drug use: Not on file   Sexual activity: Not on file  Other Topics Concern   Not on file  Social History Narrative   Not on file   Social Determinants of Health   Financial Resource Strain: Not on file  Food Insecurity: Not on file  Transportation Needs: Not on file  Physical Activity: Not on file  Stress: Not on file  Social Connections: Not on file  Intimate Partner Violence: Not on file    FAMILY HISTORY: Family History  Problem Relation Age of Onset   Breast cancer Sister 42   Breast cancer Maternal Aunt    Multiple myeloma Maternal Aunt    Uterine cancer Maternal Aunt     ALLERGIES:  has no allergies on file.  MEDICATIONS:  No current outpatient medications on file.   No current facility-administered medications for this visit.    REVIEW OF SYSTEMS:   Constitutional: Denies fevers, chills or abnormal night sweats Breast:  Denies any palpable lumps or discharge All other systems were reviewed with the patient and are negative.  PHYSICAL EXAMINATION: ECOG PERFORMANCE STATUS: 0 - Asymptomatic  Vitals:   05/22/22 1251  BP: 131/77  Pulse: 76  Resp: 17  Temp: 97.8 F (36.6 C)  SpO2: 100%   Filed Weights   05/22/22 1251  Weight: 142 lb 12.8 oz (64.8 kg)    GENERAL:alert, no distress and  comfortable    LABORATORY DATA:  I have reviewed the data as listed Lab Results  Component Value Date   WBC 5.1 05/22/2022   HGB 13.6 05/22/2022   HCT 40.4 05/22/2022   MCV 91.8 05/22/2022   PLT 210 05/22/2022   Lab Results  Component Value Date   NA 137 05/22/2022   K 4.1 05/22/2022   CL 103 05/22/2022   CO2 30 05/22/2022    RADIOGRAPHIC STUDIES: I have personally reviewed the radiological reports and agreed with the findings in the report.  ASSESSMENT AND PLAN:  Malignant  neoplasm of upper-outer quadrant of left breast in female, estrogen receptor positive ([place]) 05/13/2022:Screening mammogram detected left breast mass at 1 o'clock position measured 2.9 cm.  Axilla negative.  Biopsy revealed grade 3 IDC ER 95% PR 20% HER2 negative Ki-67 40%.  Pathology and radiology counseling:Discussed with the patient, the details of pathology including the type of breast cancer,the clinical staging, the significance of ER, PR and HER-2/neu receptors and the implications for treatment. After reviewing the pathology in detail, we proceeded to discuss the different treatment options between surgery, radiation, chemotherapy, antiestrogen therapies.  Recommendations: 1. Breast conserving surgery followed by 2. Oncotype DX testing to determine if chemotherapy would be of any benefit followed by 3. Adjuvant radiation therapy followed by 4. Adjuvant antiestrogen therapy  Oncotype counseling: I discussed Oncotype DX test. I explained to the patient that this is a 21 gene panel to evaluate patient tumors DNA to calculate recurrence score. This would help determine whether patient has high risk or low risk breast cancer. She understands that if her tumor was found to be high risk, she would benefit from systemic chemotherapy. If low risk, no need of chemotherapy.  Return to clinic after surgery to discuss final pathology report and then determine if Oncotype DX testing will need to be sent.    All questions were answered. The patient knows to call the clinic with any problems, questions or concerns.    Harriette Ohara, MD 05/22/22

## 2022-05-23 ENCOUNTER — Encounter: Payer: Self-pay | Admitting: Genetic Counselor

## 2022-05-23 DIAGNOSIS — Z803 Family history of malignant neoplasm of breast: Secondary | ICD-10-CM | POA: Insufficient documentation

## 2022-05-23 DIAGNOSIS — Z8042 Family history of malignant neoplasm of prostate: Secondary | ICD-10-CM | POA: Insufficient documentation

## 2022-05-23 NOTE — Progress Notes (Signed)
REFERRING PROVIDER: Nicholas Lose, MD  PRIMARY PROVIDER:  Louretta Shorten, MD  PRIMARY REASON FOR VISIT:  Encounter Diagnoses  Name Primary?   Malignant neoplasm of upper-outer quadrant of left breast in female, estrogen receptor positive ([place]) Yes   Family history of breast cancer    Family history of prostate cancer    HISTORY OF PRESENT ILLNESS:   Jaime Pearson, a 56 y.o. female, was seen for a [place] cancer genetics consultation during the breast multidisciplinary clinic at the request of Dr. Lindi Adie due to a personal and family history of cancer.  Jaime Pearson presents to clinic today to discuss the possibility of a hereditary predisposition to cancer, to discuss genetic testing, and to further clarify her future cancer risks, as well as potential cancer risks for family members.   In August 2023, at the age of 36, Jaime Pearson was diagnosed with invasive ductal carcinoma of the left breast.  CANCER HISTORY:  Oncology History  Malignant neoplasm of upper-outer quadrant of left breast in female, estrogen receptor positive ([place])  05/13/2022 Initial Diagnosis   Screening mammogram detected left breast mass at 1 o'clock position measured 2.9 cm.  Axilla negative.  Biopsy revealed grade 3 IDC ER 95% PR 20% HER2 negative Ki-67 40%.   05/22/2022 Cancer Staging   Staging form: Breast, AJCC 8th Edition - Clinical stage from 05/22/2022: Stage IIA (cT2, cN0, cM0, G3, ER+, PR+, HER2-) - Signed by Nicholas Lose, MD on 05/22/2022 Stage prefix: Initial diagnosis Histologic grading system: 3 grade system     Past Medical History:  Diagnosis Date   Asthma    Breast cancer ([place])    Depression     No past surgical history on file.  Social History   Socioeconomic History   Marital status: Married    Spouse name: Not on file   Number of children: Not on file   Years of education: Not on file   Highest education level: Not on file  Occupational History   Not on file   Tobacco Use   Smoking status: Never   Smokeless tobacco: Never  Substance and Sexual Activity   Alcohol use: Never   Drug use: Never   Sexual activity: Not on file  Other Topics Concern   Not on file  Social History Narrative   Not on file   Social Determinants of Health   Financial Resource Strain: Not on file  Food Insecurity: Not on file  Transportation Needs: Not on file  Physical Activity: Not on file  Stress: Not on file  Social Connections: Not on file     FAMILY HISTORY:  We obtained a detailed, 4-generation family history.  Significant diagnoses are listed below: Family History  Problem Relation Age of Onset   Prostate cancer Father    Breast cancer Sister 64   Breast cancer Maternal Aunt 4 - 49   Multiple myeloma Maternal Aunt 70 - 79   Leukemia Maternal Aunt 70 - 79   Prostate cancer Paternal Uncle      Jaime Pearson's sister was recently diagnosed with metastatic breast cancer at age 67 and reportedly had negative genetic testing. Her maternal aunt was diagnosed with breast cancer in her 19s. A second maternal aunt was diagnosed with multiple myeloma and leukemia in her 47s. Her maternal great aunt was diagnosed with ovarian cancer in her 89s, she died at age 83.   Jaime Pearson father had prostate cancer identified on his autopsy, he died at age 25. Her paternal  uncle was diagnosed with prostate cancer.   GENETIC COUNSELING ASSESSMENT: Jaime Pearson is a 56 y.o. female with a personal and family history of cancer which is somewhat suggestive of a hereditary cancer syndrome and predisposition to cancer. We, therefore, discussed and recommended the following at today's visit.   DISCUSSION: We discussed that 5 - 10% of cancer is hereditary, with most cases of hereditary breast cancer associated with mutations in BRCA1/2.  There are other genes that can be associated with hereditary breast cancer syndromes. Type of cancer risk and level of risk  are gene-specific. We discussed that testing is beneficial for several reasons including knowing how to follow individuals after completing their treatment, identifying whether potential treatment options would be beneficial, and understanding if other family members could be at risk for cancer and allowing them to undergo genetic testing.   We reviewed the characteristics, features and inheritance patterns of hereditary cancer syndromes. We also discussed genetic testing, including the appropriate family members to test, the process of testing, insurance coverage and turn-around-time for results. We discussed the implications of a negative, positive and/or variant of uncertain significant result. In order to get genetic test results in a timely manner so that Jaime Pearson can use these genetic test results for surgical decisions, we recommended Jaime Pearson pursue genetic testing for the BRCAplus panel. Once complete, we recommend Jaime Pearson pursue reflex genetic testing to a more comprehensive gene panel.   Jaime Pearson  was offered a common hereditary cancer panel (47 genes) and an expanded pan-cancer panel (77 genes). Jaime Pearson was informed of the benefits and limitations of each panel, including that expanded pan-cancer panels contain genes that do not have clear management guidelines at this point in time.  We also discussed that as the number of genes included on a panel increases, the chances of variants of uncertain significance increases.  After considering the benefits and limitations of each gene panel, Jaime Pearson elected to have [place] Panel.  The CancerNext-Expanded gene panel offered by [facility] and includes sequencing, rearrangement, and RNA analysis for the following 77 genes: AIP, ALK, APC, ATM, AXIN2, BAP1, BARD1, BLM, BMPR1A, BRCA1, BRCA2, BRIP1, CDC73, CDH1, CDK4, CDKN1B, CDKN2A, CHEK2, CTNNA1, DICER1, FANCC, FH,  FLCN, GALNT12, KIF1B, LZTR1, MAX, MEN1, MET, MLH1, MSH2, MSH3, MSH6, MUTYH, NBN, NF1, NF2, NTHL1, PALB2, PHOX2B, PMS2, POT1, PRKAR1A, PTCH1, PTEN, RAD51C, RAD51D, RB1, RECQL, RET, SDHA, SDHAF2, SDHB, SDHC, SDHD, SMAD4, SMARCA4, SMARCB1, SMARCE1, STK11, SUFU, TMEM127, TP53, TSC1, TSC2, VHL and XRCC2 (sequencing and deletion/duplication); EGFR, EGLN1, HOXB13, KIT, MITF, PDGFRA, POLD1, and POLE (sequencing only); EPCAM and GREM1 (deletion/duplication only).   Based on Jaime Pearson's personal and family history of cancer, she meets medical criteria for genetic testing. Despite that she meets criteria, she may still have an out of pocket cost. We discussed that if her out of pocket cost for testing is over $100, the laboratory should contact them to discuss self-pay prices, patient pay assistance programs, if applicable, and other billing options.   PLAN: After considering the risks, benefits, and limitations, Jaime Pearson provided informed consent to pursue genetic testing and the blood sample was sent to [facility] for analysis of the CancerNext-Expanded Panel. Results should be available within approximately 1-2 weeks' time, at which point they will be disclosed by telephone to Jaime Pearson, as will any additional recommendations warranted by these results. Jaime Pearson will receive a summary of her genetic counseling visit and a copy of her results once available. This information  will also be available in Epic.   Jaime Pearson questions were answered to her satisfaction today. Our contact information was provided should additional questions or concerns arise. Thank you for the referral and allowing [place] to share in the care of your patient.   Lucille Passy, MS, [facility] Genetic Counselor [email or web address] (P) [phone]  The patient was seen for a total of 20 minutes in face-to-face genetic counseling. The patient brought her husband. Drs. Lindi Adie  and/or Burr Medico were available to discuss this case as needed.  _______________________________________________________________________ For Office Staff:  Number of people involved in session: 2 Was an Intern/ student involved with case: no

## 2022-05-24 ENCOUNTER — Telehealth: Payer: Self-pay | Admitting: Hematology and Oncology

## 2022-05-24 ENCOUNTER — Other Ambulatory Visit: Payer: Self-pay | Admitting: General Surgery

## 2022-05-24 DIAGNOSIS — C50412 Malignant neoplasm of upper-outer quadrant of left female breast: Secondary | ICD-10-CM

## 2022-05-24 NOTE — Telephone Encounter (Signed)
Scheduled per 8/11 in basket, message has been left  

## 2022-05-27 ENCOUNTER — Telehealth: Payer: Self-pay | Admitting: Radiology

## 2022-05-27 NOTE — Telephone Encounter (Signed)
[employer] 2021-05 - Specimen Collection Study to Evaluate Biomarkers in Subjects with Cancer    05/27/22  PHONE CALL: Confirmed I was speaking with Kirt Boys . Informed patient reason for call is to follow-up on the above mentioned study. Patient states she has not had time to review consent at this time. This coordinator expressed understanding and patient is okay with a follow-up phone call Wednesday or Thursday this week. Thanked patient for her time and consideration of the above mentioned study and I will speak with her again later this week.   Carol Ada, RT(R)(T) Clinical Research Coordinator

## 2022-05-28 ENCOUNTER — Encounter (HOSPITAL_BASED_OUTPATIENT_CLINIC_OR_DEPARTMENT_OTHER): Payer: Self-pay | Admitting: General Surgery

## 2022-05-28 ENCOUNTER — Telehealth: Payer: Self-pay | Admitting: *Deleted

## 2022-05-28 ENCOUNTER — Other Ambulatory Visit: Payer: Self-pay

## 2022-05-28 ENCOUNTER — Encounter: Payer: Self-pay | Admitting: *Deleted

## 2022-05-28 NOTE — Telephone Encounter (Signed)
Spoke to pt concerning [place] from 05/22/22. Denies questions or concerns regarding dx or treatment care plan. Encourage pt to call with needs. Received verbal understanding. Confirmed future appts.

## 2022-05-30 ENCOUNTER — Telehealth: Payer: Self-pay | Admitting: Radiology

## 2022-05-30 NOTE — Progress Notes (Signed)

## 2022-05-30 NOTE — Telephone Encounter (Signed)
[employer] 2021-05 - Specimen Collection Study to Evaluate Biomarkers in Subjects with Cancer    05/30/22  PHONE CALL: Confirmed I was speaking with Jaime Pearson .Informed patient reason for call is to follow-up on the above mentioned study. Patient she has not had a chance to fully review consent documents, but is interested in participation. Appt was scheduled for Monday 8/21 to review and sign consent documents as well collect labs. Patient was thanked for her time and support of the above mentioned study. Look forward to seeing patient on Monday.   Carol Ada, RT(R)(T) Clinical Research Coordinator

## 2022-05-31 ENCOUNTER — Telehealth: Payer: Self-pay | Admitting: Hematology and Oncology

## 2022-05-31 NOTE — Telephone Encounter (Signed)
Rescheduled appointment per Candace in research via telephone call. Candace stated that the patient was already aware of the changes made to her upcoming appointment.

## 2022-06-03 ENCOUNTER — Inpatient Hospital Stay: Payer: BC Managed Care – PPO | Admitting: Radiology

## 2022-06-03 ENCOUNTER — Telehealth: Payer: Self-pay | Admitting: Genetic Counselor

## 2022-06-03 ENCOUNTER — Encounter: Payer: Self-pay | Admitting: Genetic Counselor

## 2022-06-03 ENCOUNTER — Other Ambulatory Visit: Payer: Self-pay

## 2022-06-03 ENCOUNTER — Ambulatory Visit
Admission: RE | Admit: 2022-06-03 | Discharge: 2022-06-03 | Disposition: A | Discharge status: Home / Self Care | Payer: BC Managed Care – PPO | Source: Ambulatory Visit | Attending: General Surgery | Admitting: General Surgery

## 2022-06-03 ENCOUNTER — Inpatient Hospital Stay: Payer: BC Managed Care – PPO

## 2022-06-03 DIAGNOSIS — C50412 Malignant neoplasm of upper-outer quadrant of left female breast: Secondary | ICD-10-CM

## 2022-06-03 DIAGNOSIS — Z006 Encounter for examination for normal comparison and control in clinical research program: Secondary | ICD-10-CM

## 2022-06-03 DIAGNOSIS — Z1379 Encounter for other screening for genetic and chromosomal anomalies: Secondary | ICD-10-CM | POA: Insufficient documentation

## 2022-06-03 LAB — RESEARCH LABS

## 2022-06-03 NOTE — H&P (Signed)
REFERRING PHYSICIAN: Claudie Revering, MD  PROVIDER: Georgianne Fick, MD  Care Team: Patient Care Team: Georgianne Fick, MD as Consulting Provider (Surgical Oncology) Rulon Eisenmenger, MD (Hematology and Oncology) Blair Promise, MD (Radiation Oncology)   MRN: [id number] DOB: [birth year] DATE OF ENCOUNTER: 05/22/2022  Subjective   Chief Complaint: Breast Cancer   History of Present Illness: Jaime Pearson is a 56 y.o. female who is seen today as an office consultation at the request of Dr. Lindi Adie for evaluation of Breast Cancer  Pt presents with a diagnosis of left breast cancer 04/2022. She had a screening detected left breast mass. Diagnostic imaging confirmed this and showed a 2.9 cm mass at 1 o'clock. The axilla was negative. Core needle biopsy was performed and was found to be grade 3 invasive ductal carcinoma, strongly ER/PR positive, her 2 negative, Ki 67 40%. She hasn't previously had a diagnosis of cancer before this. In the past she has had breast cysts aspirated, but not required other biopsies. 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 She is nulliparous. She is accompanied by her husband.   Family cancer history  Mother had breast cancer,  Maternal aunt had multiple myeloma Maternal aunt uterine cancer, lobular metastatic breast cancer  works in administration Enjoys calligraphy  Diagnostic mammogram/us 05/08/2022 BCG ACR Breast Density Category c: The breast tissue is heterogeneously dense, which may obscure small masses.  FINDINGS: There is a suspicious mass in the slightly lateral superior left breast with associated spiculation.  Targeted ultrasound is performed, showing an irregular mass containing calcifications in the left breast at 1 o'clock, 7 cm from the nipple measuring 2.8 x 1.8 x 2.9 cm. No axillary adenopathy.  IMPRESSION: Highly suspicious left breast mass. No adenopathy.  RECOMMENDATION: Recommend  ultrasound-guided biopsy of the left breast mass.  I have discussed the findings and recommendations with the patient. If applicable, a reminder letter will be sent to the patient regarding the next appointment.  BI-RADS CATEGORY 5: Highly suggestive of malignancy.   Pathology core needle biopsy: 05/13/2022 Breast, left, needle core biopsy, 2.9 cm mass 1:00 - INVASIVE DUCTAL CARCINOMA, SEE NOTE - TUBULE FORMATION: SCORE 3 - NUCLEAR PLEOMORPHISM: SCORE 3 - MITOTIC COUNT: SCORE 3 - TOTAL SCORE: 9 - OVERALL GRADE: 3 - LYMPHOVASCULAR INVASION: NOT IDENTIFIED - CANCER LENGTH: 1.3 CM - CALCIFICATIONS: NOT IDENTIFIED - OTHER FINDINGS: NONE  Receptors: Estrogen Receptor: 95%, POSITIVE, STRONG STAINING INTENSITY Progesterone Receptor: 20%, POSITIVE, MODERATE STAINING INTENSITY Proliferation Marker Ki67: 40% GROUP 5: HER2 **NEGATIVE**  Review of Systems: A complete review of systems was obtained from the patient. I have reviewed this information and discussed as appropriate with the patient. See HPI as well for other ROS.  Medical History: Past Medical History:  Diagnosis Date  Asthma, unspecified asthma severity, unspecified whether complicated, unspecified whether persistent  History of cancer   Patient Active Problem List  Diagnosis  Malignant neoplasm of upper-outer quadrant of left breast in female, estrogen receptor positive (CMS-HCC)  Family history of cancer   History reviewed. No pertinent surgical history.   No Known Allergies  Current Outpatient Medications on File Prior to Visit  Medication Sig Dispense Refill  cetirizine (ZYRTEC) 10 MG tablet Take 1 tablet by mouth once daily  estradioL (ESTRACE) 0.01 % (0.1 mg/gram) vaginal cream PLACE 0.5GM VAGINALLY TWICE A WEEK IN THE EVENING  gabapentin (NEURONTIN) 300 MG capsule Take by mouth  montelukast (SINGULAIR) 10 mg tablet Take 1 tablet by mouth once daily  No current facility-administered medications on file prior  to visit.   Family History  Problem Relation Age of Onset  Skin cancer Mother  Breast cancer Sister    Social History   Tobacco Use  Smoking Status Never  Smokeless Tobacco Never    Social History   Socioeconomic History  Marital status: Single  Tobacco Use  Smoking status: Never  Smokeless tobacco: Never  Substance and Sexual Activity  Alcohol use: Not Currently  Drug use: Not Currently   Objective:   Vitals:  05/22/22 1536  BP: 131/77  Pulse: 76  Resp: 17  Temp: 36.6 C (97.8 F)  Weight: 64.8 kg (142 lb 12.8 oz)   There is no height or weight on file to calculate BMI.  Gen: No acute distress. Well nourished and well groomed.  Neurological: Alert and oriented to person, place, and time. Coordination normal.  Head: Normocephalic and atraumatic.  Eyes: Conjunctivae are normal. Pupils are equal, round, and reactive to light. No scleral icterus.  Neck: Normal range of motion. Neck supple. No tracheal deviation or thyromegaly present.  Cardiovascular: Normal rate, regular rhythm, normal heart sounds and intact distal pulses. Exam reveals no gallop and no friction rub. No murmur heard. Breast: moderate ptosis. Vaguely palpable mass/hematoma upper outer left breast. Faint bruising. No LAD. No nipple retraction or nipple discharge. No skin dimpling. Right breast benign.  Respiratory: Effort normal. No respiratory distress. No chest wall tenderness. Breath sounds normal. No wheezes, rales or rhonchi.  GI: Soft. Bowel sounds are normal. The abdomen is soft and nontender. There is no rebound and no guarding.  Musculoskeletal: Normal range of motion. Extremities are nontender.  Lymphadenopathy: No cervical, preauricular, postauricular or axillary adenopathy is present Skin: Skin is warm and dry. No rash noted. No diaphoresis. No erythema. No pallor. No clubbing, cyanosis, or edema.  Psychiatric: Normal mood and affect. Behavior is normal. Judgment and thought content normal.    Labs CBC, CMET normal 05/22/2022  Assessment and Plan:   ICD-10-CM  1. Malignant neoplasm of upper-outer quadrant of left breast in female, estrogen receptor positive (CMS-HCC) C50.412  Z17.0   2. Family history of cancer Z80.9    Pt has a new diagnosis of cT2N0 left breast cancer. Will plan seed localized lumpectomy, sentinel node biopsy. This will be followed by oncotype with +/- chemo, radiation, and antihormonal treatment.   Genetic testing has been sent.   The surgical procedure was described to the patient. I discussed the incision type and location and that we would need radiology involved on with a wire or seed marker and/or sentinel node.   The risks and benefits of the procedure were described to the patient and she wishes to proceed.   We discussed the risks bleeding, infection, damage to other structures, need for further procedures/surgeries. We discussed the risk of seroma. The patient was advised if the area in the breast in cancer, we may need to go back to surgery for additional tissue to obtain negative margins or for a lymph node biopsy. The patient was advised that these are the most common complications, but that others can occur as well. They were advised against taking aspirin or other anti-inflammatory agents/blood thinners the week before surgery.   No follow-ups on file.  Milus Height, MD FACS Surgical Oncology, General Surgery, Trauma and [facility] [place]

## 2022-06-03 NOTE — Research (Signed)
[employer] 2021-05 - Specimen Collection Study to Evaluate Biomarkers in Subjects with Cancer    06/03/2022  ELIGIBILITY:  This Coordinator has reviewed this patient's inclusion and exclusion criteria and confirmed Jaime Pearson is eligible for study participation.  Patient will continue with enrollment.  Eligibility confirmed by research nurse and treating investigator, who also agrees that patient should proceed with enrollment.   CONSENT: Patient Jaime Pearson was identified by Dr. Lindi Adie as a potential candidate for the above listed study.  This Clinical Research Coordinator met with Adiya Selmer, [id number], on 06/03/22 in a manner and location that ensures patient privacy to discuss participation in the above listed research study.  Patient is Unaccompanied.  A copy of the informed consent document with embedded HIPAA language was provided to the patient.  Patient reads, speaks, and understands [place].    Patient was provided with the business card of this Coordinator and encouraged to contact the research team with any questions.  Patient was provided the option of taking informed consent documents home to review and was encouraged to review at their convenience with their support network, including other care providers. Patient is comfortable with making a decision regarding study participation today.  As outlined in the informed consent form, this Coordinator and Kyndell Zeiser discussed the purpose of the research study, the investigational nature of the study, study procedures and requirements for study participation, potential risks and benefits of study participation, as well as alternatives to participation. This study is not blinded. The patient understands participation is voluntary and they may withdraw from study participation at any time.  This study does not involve randomization.  This study does not involve an investigational  drug or device. This study does not involve a placebo. Patient understands enrollment is pending full eligibility review.   Confidentiality and how the patient's information will be used as part of study participation were discussed.  Patient was informed there is reimbursement provided for their time and effort spent on trial participation.  The patient is encouraged to discuss research study participation with their insurance provider to determine what costs they may incur as part of study participation, including research related injury.    All questions were answered to patient's satisfaction.  The informed consent with embedded HIPAA language was reviewed page by page.  The patient's mental and emotional status is appropriate to provide informed consent, and the patient verbalizes an understanding of study participation.  Patient has agreed to participate in the above listed research study and has voluntarily signed the informed consent version dated 12 Nov 2021 with embedded HIPAA language, version dated 12 Nov 2021  on 06/03/22 at 1:09PM.  The patient was provided with a copy of the signed informed consent form with embedded HIPAA language for their reference.  No study specific procedures were obtained prior to the signing of the informed consent document.  Approximately 15 minutes were spent with the patient reviewing the informed consent documents.  Patient was not requested to complete a Release of Information form.  After consent was complete, medical history was obtained as follows: Medical History:  High Blood Pressure  No Coronary Artery Disease No Lupus    No Rheumatoid Arthritis  No Diabetes   No      Lynch Syndrome  No  Is the patient currently taking a magnesium supplement?   No   Does the patient have a personal history of cancer (greater than 5 years ago)?  No  Does the patient have a family  history of cancer in 1st or 2nd degree relatives? Yes If yes, Relationship(s) and  Cancer type(s)?  Sister: breast Maternal aunts: breast (2000), ovarian (2013), multiple myeloma Paternal uncle: prostate  Does the patient have history of alcohol consumption? No     Does the patient have history of cigarette, cigar, pipe, or chewing tobacco use?  No   After completion of medical history, patient was escorted to lab by Farris Has, Clinical Research assistant:  LABS: Blood Collection: Research blood obtained by  Fresh venipuncture. Patient tolerated well without any adverse events. Gift Card: $50 gift card given to patient for her participation in this study.    Patient was thanked for her time and support of the above mentioned study.   Carol Ada, RT(R)(T) Clinical Research Coordinator

## 2022-06-03 NOTE — Research (Signed)
[employer] 2021-05 - Specimen Collection Study to Evaluate Biomarkers in Subjects with Cancer   This Nurse has reviewed this patient's inclusion and exclusion criteria as a second review and confirms Jaime Pearson is eligible for study participation.  Patient may continue with enrollment.  Foye Spurling, BSN, RN, [employer] Research Nurse II 06/03/2022 1:27 PM

## 2022-06-03 NOTE — Telephone Encounter (Signed)
LM on VM that results of BRCAPlus genetic testing is back and to please call for results.  Left CB instructions.

## 2022-06-03 NOTE — Telephone Encounter (Signed)
Revealed normal BRCAPlus testing.  Explained that this test plays a role in surgical management.  This normal result suggests that her surgery will be based on her current diagnosis and not based on the genetic testing.  The remainder of testing will be back in about 2 weeks and someone will call.

## 2022-06-03 NOTE — Progress Notes (Signed)
ERROR

## 2022-06-04 ENCOUNTER — Ambulatory Visit
Admission: RE | Admit: 2022-06-04 | Discharge: 2022-06-04 | Disposition: A | Discharge status: Home / Self Care | Payer: BC Managed Care – PPO | Source: Ambulatory Visit | Attending: General Surgery | Admitting: General Surgery

## 2022-06-04 ENCOUNTER — Encounter (HOSPITAL_BASED_OUTPATIENT_CLINIC_OR_DEPARTMENT_OTHER): Payer: Self-pay | Admitting: General Surgery

## 2022-06-04 ENCOUNTER — Encounter (HOSPITAL_BASED_OUTPATIENT_CLINIC_OR_DEPARTMENT_OTHER)
Admission: RE | Disposition: A | Discharge status: Home / Self Care | Payer: Self-pay | Source: Ambulatory Visit | Attending: General Surgery

## 2022-06-04 ENCOUNTER — Ambulatory Visit (HOSPITAL_BASED_OUTPATIENT_CLINIC_OR_DEPARTMENT_OTHER): Payer: BC Managed Care – PPO | Admitting: Anesthesiology

## 2022-06-04 ENCOUNTER — Other Ambulatory Visit: Payer: Self-pay

## 2022-06-04 ENCOUNTER — Ambulatory Visit (HOSPITAL_BASED_OUTPATIENT_CLINIC_OR_DEPARTMENT_OTHER)
Admission: RE | Admit: 2022-06-04 | Discharge: 2022-06-04 | Disposition: A | Discharge status: Home / Self Care | Payer: BC Managed Care – PPO | Source: Ambulatory Visit | Attending: General Surgery | Admitting: General Surgery

## 2022-06-04 DIAGNOSIS — C50412 Malignant neoplasm of upper-outer quadrant of left female breast: Secondary | ICD-10-CM | POA: Diagnosis present

## 2022-06-04 DIAGNOSIS — Z807 Family history of other malignant neoplasms of lymphoid, hematopoietic and related tissues: Secondary | ICD-10-CM | POA: Insufficient documentation

## 2022-06-04 DIAGNOSIS — Z170 Estrogen receptor positive status [ER+]: Secondary | ICD-10-CM | POA: Diagnosis not present

## 2022-06-04 DIAGNOSIS — Z8049 Family history of malignant neoplasm of other genital organs: Secondary | ICD-10-CM | POA: Insufficient documentation

## 2022-06-04 DIAGNOSIS — Z803 Family history of malignant neoplasm of breast: Secondary | ICD-10-CM | POA: Diagnosis not present

## 2022-06-04 DIAGNOSIS — J45909 Unspecified asthma, uncomplicated: Secondary | ICD-10-CM | POA: Insufficient documentation

## 2022-06-04 HISTORY — DX: Family history of other specified conditions: Z84.89

## 2022-06-04 HISTORY — PX: BREAST LUMPECTOMY WITH RADIOACTIVE SEED AND SENTINEL LYMPH NODE BIOPSY: SHX6550

## 2022-06-04 SURGERY — BREAST LUMPECTOMY WITH RADIOACTIVE SEED AND SENTINEL LYMPH NODE BIOPSY
Anesthesia: Regional | Site: Breast | Laterality: Left

## 2022-06-04 MED ORDER — OXYCODONE HCL 5 MG/5ML PO SOLN: 5.0000 mg | Freq: Once | ORAL | Status: DC | PRN

## 2022-06-04 MED ORDER — FENTANYL CITRATE (PF) 100 MCG/2ML IJ SOLN
INTRAMUSCULAR | Status: AC
  Filled 2022-06-04: qty 2

## 2022-06-04 MED ORDER — CEFAZOLIN SODIUM-DEXTROSE 2-4 GM/100ML-% IV SOLN
2.0000 g | INTRAVENOUS | Status: AC
  Administered 2022-06-04: 2 g via INTRAVENOUS

## 2022-06-04 MED ORDER — METHYLENE BLUE 1 % INJ SOLN
INTRAVENOUS | Status: AC
  Filled 2022-06-04: qty 10

## 2022-06-04 MED ORDER — FENTANYL CITRATE (PF) 100 MCG/2ML IJ SOLN
100.0000 ug | Freq: Once | INTRAMUSCULAR | Status: AC
  Administered 2022-06-04: 50 ug via INTRAVENOUS

## 2022-06-04 MED ORDER — PROMETHAZINE HCL 25 MG/ML IJ SOLN: 6.2500 mg | INTRAMUSCULAR | Status: DC | PRN

## 2022-06-04 MED ORDER — FENTANYL CITRATE (PF) 100 MCG/2ML IJ SOLN
INTRAMUSCULAR | Status: DC | PRN
  Administered 2022-06-04 (×2): 50 ug via INTRAVENOUS

## 2022-06-04 MED ORDER — DEXAMETHASONE SODIUM PHOSPHATE 10 MG/ML IJ SOLN
INTRAMUSCULAR | Status: AC
  Filled 2022-06-04: qty 1

## 2022-06-04 MED ORDER — LACTATED RINGERS IV SOLN: INTRAVENOUS | Status: DC

## 2022-06-04 MED ORDER — MIDAZOLAM HCL 2 MG/2ML IJ SOLN
INTRAMUSCULAR | Status: AC
  Filled 2022-06-04: qty 2

## 2022-06-04 MED ORDER — KETOROLAC TROMETHAMINE 30 MG/ML IJ SOLN
INTRAMUSCULAR | Status: AC
  Filled 2022-06-04: qty 1

## 2022-06-04 MED ORDER — CHLORHEXIDINE GLUCONATE CLOTH 2 % EX PADS: 6.0000 | MEDICATED_PAD | Freq: Once | CUTANEOUS | Status: DC

## 2022-06-04 MED ORDER — LIDOCAINE-EPINEPHRINE (PF) 1 %-1:200000 IJ SOLN
INTRAMUSCULAR | Status: DC | PRN
  Administered 2022-06-04: 11 mL via INTRAMUSCULAR

## 2022-06-04 MED ORDER — LIDOCAINE 2% (20 MG/ML) 5 ML SYRINGE
INTRAMUSCULAR | Status: AC
Start: 2022-06-04 — End: ?
  Filled 2022-06-04: qty 5

## 2022-06-04 MED ORDER — BUPIVACAINE-EPINEPHRINE (PF) 0.5% -1:200000 IJ SOLN
INTRAMUSCULAR | Status: DC | PRN
  Administered 2022-06-04: 30 mL via PERINEURAL

## 2022-06-04 MED ORDER — ACETAMINOPHEN 500 MG PO TABS
ORAL_TABLET | ORAL | Status: AC
  Filled 2022-06-04: qty 2

## 2022-06-04 MED ORDER — 0.9 % SODIUM CHLORIDE (POUR BTL) OPTIME
TOPICAL | Status: DC | PRN
  Administered 2022-06-04: 200 mL

## 2022-06-04 MED ORDER — AMISULPRIDE (ANTIEMETIC) 5 MG/2ML IV SOLN
10.0000 mg | Freq: Once | INTRAVENOUS | Status: DC | PRN
Start: 2022-06-04 — End: 2022-06-04

## 2022-06-04 MED ORDER — LIDOCAINE-EPINEPHRINE (PF) 1 %-1:200000 IJ SOLN
INTRAMUSCULAR | Status: AC
  Filled 2022-06-04: qty 30

## 2022-06-04 MED ORDER — ONDANSETRON HCL 4 MG/2ML IJ SOLN
INTRAMUSCULAR | Status: AC
  Filled 2022-06-04: qty 2

## 2022-06-04 MED ORDER — EPHEDRINE SULFATE (PRESSORS) 50 MG/ML IJ SOLN
INTRAMUSCULAR | Status: DC | PRN
  Administered 2022-06-04 (×2): 10 mg via INTRAVENOUS

## 2022-06-04 MED ORDER — ACETAMINOPHEN 500 MG PO TABS
1000.0000 mg | ORAL_TABLET | ORAL | Status: AC
  Administered 2022-06-04: 1000 mg via ORAL

## 2022-06-04 MED ORDER — OXYCODONE HCL 5 MG PO TABS: 5.0000 mg | ORAL_TABLET | Freq: Four times a day (QID) | ORAL | 0 refills | Status: AC | PRN

## 2022-06-04 MED ORDER — FENTANYL CITRATE (PF) 100 MCG/2ML IJ SOLN: 25.0000 ug | INTRAMUSCULAR | Status: DC | PRN

## 2022-06-04 MED ORDER — PROPOFOL 10 MG/ML IV BOLUS
INTRAVENOUS | Status: DC | PRN
  Administered 2022-06-04: 200 mg via INTRAVENOUS

## 2022-06-04 MED ORDER — PHENYLEPHRINE 80 MCG/ML (10ML) SYRINGE FOR IV PUSH (FOR BLOOD PRESSURE SUPPORT)
PREFILLED_SYRINGE | INTRAVENOUS | Status: AC
  Filled 2022-06-04: qty 10

## 2022-06-04 MED ORDER — SUCCINYLCHOLINE CHLORIDE 200 MG/10ML IV SOSY
PREFILLED_SYRINGE | INTRAVENOUS | Status: AC
  Filled 2022-06-04: qty 10

## 2022-06-04 MED ORDER — KETOROLAC TROMETHAMINE 30 MG/ML IJ SOLN
30.0000 mg | Freq: Once | INTRAMUSCULAR | Status: AC | PRN
Start: 2022-06-04 — End: 2022-06-04
  Administered 2022-06-04: 30 mg via INTRAVENOUS

## 2022-06-04 MED ORDER — BUPIVACAINE HCL (PF) 0.25 % IJ SOLN
INTRAMUSCULAR | Status: AC
  Filled 2022-06-04: qty 30

## 2022-06-04 MED ORDER — ATROPINE SULFATE 0.4 MG/ML IV SOLN
INTRAVENOUS | Status: AC
  Filled 2022-06-04: qty 1

## 2022-06-04 MED ORDER — EPHEDRINE 5 MG/ML INJ
INTRAVENOUS | Status: AC
  Filled 2022-06-04: qty 5

## 2022-06-04 MED ORDER — MAGTRACE LYMPHATIC TRACER
INTRAMUSCULAR | Status: DC | PRN
  Administered 2022-06-04: 2 mL via INTRAMUSCULAR

## 2022-06-04 MED ORDER — OXYCODONE HCL 5 MG PO TABS: 5.0000 mg | ORAL_TABLET | Freq: Once | ORAL | Status: DC | PRN

## 2022-06-04 MED ORDER — LIDOCAINE 2% (20 MG/ML) 5 ML SYRINGE
INTRAMUSCULAR | Status: DC | PRN
  Administered 2022-06-04: 40 mg via INTRAVENOUS

## 2022-06-04 MED ORDER — CEFAZOLIN SODIUM-DEXTROSE 2-4 GM/100ML-% IV SOLN
INTRAVENOUS | Status: AC
  Filled 2022-06-04: qty 100

## 2022-06-04 MED ORDER — MIDAZOLAM HCL 5 MG/5ML IJ SOLN
INTRAMUSCULAR | Status: DC | PRN
  Administered 2022-06-04: 1 mg via INTRAVENOUS

## 2022-06-04 MED ORDER — DEXAMETHASONE SODIUM PHOSPHATE 4 MG/ML IJ SOLN
INTRAMUSCULAR | Status: DC | PRN
  Administered 2022-06-04: 5 mg via INTRAVENOUS

## 2022-06-04 MED ORDER — MIDAZOLAM HCL 2 MG/2ML IJ SOLN
2.0000 mg | Freq: Once | INTRAMUSCULAR | Status: AC
  Administered 2022-06-04: 2 mg via INTRAVENOUS

## 2022-06-04 SURGICAL SUPPLY — 68 items
ADH SKN CLS APL DERMABOND .7 (GAUZE/BANDAGES/DRESSINGS) ×1
APL PRP STRL LF DISP 70% ISPRP (MISCELLANEOUS) ×1
BINDER BREAST LRG (GAUZE/BANDAGES/DRESSINGS) IMPLANT
BINDER BREAST MEDIUM (GAUZE/BANDAGES/DRESSINGS) IMPLANT
BINDER BREAST XLRG (GAUZE/BANDAGES/DRESSINGS) IMPLANT
BINDER BREAST XXLRG (GAUZE/BANDAGES/DRESSINGS) IMPLANT
BLADE SURG 10 STRL SS (BLADE) ×2 IMPLANT
BLADE SURG 15 STRL LF DISP TIS (BLADE) ×2 IMPLANT
BLADE SURG 15 STRL SS (BLADE) ×1
BNDG CMPR 5X4 CHSV STRCH STRL (GAUZE/BANDAGES/DRESSINGS) ×1
BNDG COHESIVE 4X5 TAN STRL LF (GAUZE/BANDAGES/DRESSINGS) ×2 IMPLANT
CANISTER SUC SOCK COL 7IN (MISCELLANEOUS) IMPLANT
CANISTER SUCT 1200ML W/VALVE (MISCELLANEOUS) ×2 IMPLANT
CHLORAPREP W/TINT 26 (MISCELLANEOUS) ×2 IMPLANT
CLIP TI LARGE 6 (CLIP) ×2 IMPLANT
CLIP TI MEDIUM 6 (CLIP) ×4 IMPLANT
CLIP TI WIDE RED SMALL 6 (CLIP) IMPLANT
COVER MAYO STAND STRL (DRAPES) ×4 IMPLANT
COVER PROBE W GEL 5X96 (DRAPES) ×2 IMPLANT
DERMABOND ADVANCED (GAUZE/BANDAGES/DRESSINGS) ×1
DERMABOND ADVANCED .7 DNX12 (GAUZE/BANDAGES/DRESSINGS) ×2 IMPLANT
DRAPE TOWEL STERILE LF 18X24 (DRAPES) IMPLANT
DRAPE UTILITY XL STRL (DRAPES) ×2 IMPLANT
DRSG PAD ABDOMINAL 8X10 ST (GAUZE/BANDAGES/DRESSINGS) ×2 IMPLANT
ELECT BLADE 4.0 EZ CLEAN MEGAD (MISCELLANEOUS) ×1
ELECT COATED BLADE 2.86 ST (ELECTRODE) ×2 IMPLANT
ELECT REM PT RETURN 9FT ADLT (ELECTROSURGICAL) ×1
ELECTRODE BLDE 4.0 EZ CLN MEGD (MISCELLANEOUS) IMPLANT
ELECTRODE REM PT RTRN 9FT ADLT (ELECTROSURGICAL) ×2 IMPLANT
GAUZE SPONGE 4X4 12PLY STRL LF (GAUZE/BANDAGES/DRESSINGS) ×2 IMPLANT
GLOVE BIO SURGEON STRL SZ 6 (GLOVE) ×2 IMPLANT
GLOVE BIOGEL PI IND STRL 6.5 (GLOVE) ×2 IMPLANT
GLOVE BIOGEL PI INDICATOR 6.5 (GLOVE) ×1
GOWN STRL REUS W/ TWL LRG LVL3 (GOWN DISPOSABLE) ×2 IMPLANT
GOWN STRL REUS W/TWL 2XL LVL3 (GOWN DISPOSABLE) ×2 IMPLANT
GOWN STRL REUS W/TWL LRG LVL3 (GOWN DISPOSABLE) ×1
KIT MARKER MARGIN INK (KITS) ×2 IMPLANT
LIGHT WAVEGUIDE WIDE FLAT (MISCELLANEOUS) IMPLANT
NDL HYPO 25X1 1.5 SAFETY (NEEDLE) ×2 IMPLANT
NDL SAFETY ECLIPSE 18X1.5 (NEEDLE) ×2 IMPLANT
NEEDLE HYPO 18GX1.5 SHARP (NEEDLE) ×1
NEEDLE HYPO 25X1 1.5 SAFETY (NEEDLE) ×1 IMPLANT
NS IRRIG 1000ML POUR BTL (IV SOLUTION) ×2 IMPLANT
PACK BASIN DAY SURGERY FS (CUSTOM PROCEDURE TRAY) ×2 IMPLANT
PACK UNIVERSAL I (CUSTOM PROCEDURE TRAY) ×2 IMPLANT
PENCIL SMOKE EVACUATOR (MISCELLANEOUS) ×2 IMPLANT
SLEEVE SCD COMPRESS KNEE MED (STOCKING) ×2 IMPLANT
SPIKE FLUID TRANSFER (MISCELLANEOUS) IMPLANT
SPONGE T-LAP 18X18 ~~LOC~~+RFID (SPONGE) ×4 IMPLANT
STAPLER VISISTAT 35W (STAPLE) IMPLANT
STOCKINETTE IMPERVIOUS LG (DRAPES) ×2 IMPLANT
STRIP CLOSURE SKIN 1/2X4 (GAUZE/BANDAGES/DRESSINGS) ×2 IMPLANT
SUT ETHILON 2 0 FS 18 (SUTURE) IMPLANT
SUT MNCRL AB 4-0 PS2 18 (SUTURE) ×2 IMPLANT
SUT MON AB 5-0 PS2 18 (SUTURE) IMPLANT
SUT SILK 2 0 SH (SUTURE) IMPLANT
SUT VIC AB 2-0 SH 27 (SUTURE) ×1
SUT VIC AB 2-0 SH 27XBRD (SUTURE) ×2 IMPLANT
SUT VIC AB 3-0 SH 27 (SUTURE) ×1
SUT VIC AB 3-0 SH 27X BRD (SUTURE) ×2 IMPLANT
SUT VICRYL 3-0 CR8 SH (SUTURE) ×2 IMPLANT
SYR BULB EAR ULCER 3OZ GRN STR (SYRINGE) ×2 IMPLANT
SYR CONTROL 10ML LL (SYRINGE) ×2 IMPLANT
TOWEL GREEN STERILE FF (TOWEL DISPOSABLE) ×2 IMPLANT
TRACER MAGTRACE VIAL (MISCELLANEOUS) IMPLANT
TRAY FAXITRON CT DISP (TRAY / TRAY PROCEDURE) ×2 IMPLANT
TUBE CONNECTING 20X1/4 (TUBING) ×2 IMPLANT
YANKAUER SUCT BULB TIP NO VENT (SUCTIONS) ×2 IMPLANT

## 2022-06-04 NOTE — Progress Notes (Signed)
Assisted Dr. Ellender with left, pectoralis, ultrasound guided block. Side rails up, monitors on throughout procedure. See vital signs in flow sheet. Tolerated Procedure well. 

## 2022-06-04 NOTE — Discharge Instructions (Addendum)
[facility] Office Phone Number [phone]  BREAST BIOPSY/ PARTIAL MASTECTOMY: POST OP INSTRUCTIONS  Always review your discharge instruction sheet given to you by the facility where your surgery was performed.  IF YOU HAVE DISABILITY OR FAMILY LEAVE FORMS, YOU MUST BRING THEM TO THE OFFICE FOR PROCESSING.  DO NOT GIVE THEM TO YOUR DOCTOR.  Take 2 tylenol (acetominophen) three times a day for 3 days.  If you still have pain, add ibuprofen with food in between if able to take this (if you have kidney issues or stomach issues, do not take ibuprofen).  If both of those are not enough, add the narcotic pain pill.  If you find you are needing a lot of this overnight after surgery, call the next morning for a refill.    Prescriptions will not be filled after 5pm or on week-ends. Take your usually prescribed medications unless otherwise directed You should eat very light the first 24 hours after surgery, such as soup, crackers, pudding, etc.  Resume your normal diet the day after surgery. Most patients will experience some swelling and bruising in the breast.  Ice packs and a good support bra will help.  Swelling and bruising can take several days to resolve.  It is common to experience some constipation if taking pain medication after surgery.  Increasing fluid intake and taking a stool softener will usually help or prevent this problem from occurring.  A mild laxative (Milk of Magnesia or Miralax) should be taken according to package directions if there are no bowel movements after 48 hours. Unless discharge instructions indicate otherwise, you may remove your bandages 48 hours after surgery, and you may shower at that time.  You may have steri-strips (small skin tapes) in place directly over the incision.  These strips should be left on the skin at least for for 7-10 days.    ACTIVITIES:  You may resume regular daily activities (gradually increasing) beginning the next day.  Wearing a  good support bra or sports bra (or the breast binder) minimizes pain and swelling.  You may have sexual intercourse when it is comfortable. No heavy lifting for 1-2 weeks (not over around 10 pounds).  You may drive when you no longer are taking prescription pain medication, you can comfortably wear a seatbelt, and you can safely maneuver your car and apply brakes. RETURN TO WORK:  __________3-14 days depending on job. _______________ You should see your doctor in the office for a follow-up appointment approximately two weeks after your surgery.  Your doctor's nurse will typically make your follow-up appointment when she calls you with your pathology report.  Expect your pathology report 3-4 business days after your surgery.  You may call to check if you do not hear from us after three days.   WHEN TO CALL YOUR DOCTOR: Fever over 101.0 Nausea and/or vomiting. Extreme swelling or bruising. Continued bleeding from incision. Increased pain, redness, or drainage from the incision.  The clinic staff is available to answer your questions during regular business hours.  Please don't hesitate to call and ask to speak to one of the nurses for clinical concerns.  If you have a medical emergency, go to the nearest emergency room or call 911.  A surgeon from [facility] is always on call at the hospital.  For further questions, please visit [email or web address]    Post Anesthesia Home Care Instructions  Activity: Get plenty of rest for the remainder of the day. A responsible individual must stay   with you for 24 hours following the procedure.  For the next 24 hours, DO NOT: -Drive a car -Operate machinery -Drink alcoholic beverages -Take any medication unless instructed by your physician -Make any legal decisions or sign important papers.  Meals: Start with liquid foods such as gelatin or soup. Progress to regular foods as tolerated. Avoid greasy, spicy, heavy foods. If nausea  and/or vomiting occur, drink only clear liquids until the nausea and/or vomiting subsides. Call your physician if vomiting continues.  Special Instructions/Symptoms: Your throat may feel dry or sore from the anesthesia or the breathing tube placed in your throat during surgery. If this causes discomfort, gargle with warm salt water. The discomfort should disappear within 24 hours.  If you had a scopolamine patch placed behind your ear for the management of post- operative nausea and/or vomiting:  1. The medication in the patch is effective for 72 hours, after which it should be removed.  Wrap patch in a tissue and discard in the trash. Wash hands thoroughly with soap and water. 2. You may remove the patch earlier than 72 hours if you experience unpleasant side effects which may include dry mouth, dizziness or visual disturbances. 3. Avoid touching the patch. Wash your hands with soap and water after contact with the patch.     

## 2022-06-04 NOTE — Anesthesia Preprocedure Evaluation (Addendum)
Anesthesia Evaluation  Patient identified by MRN, date of birth, ID band Patient awake    Reviewed: Allergy & Precautions, NPO status , Patient's Chart, lab work & pertinent test results  Airway Mallampati: II  TM Distance: >3 FB Neck ROM: Full    Dental no notable dental hx.    Pulmonary asthma ,    Pulmonary exam normal        Cardiovascular negative cardio ROS Normal cardiovascular exam     Neuro/Psych PSYCHIATRIC DISORDERS Depression negative neurological ROS     GI/Hepatic negative GI ROS, Neg liver ROS,   Endo/Other  negative endocrine ROS  Renal/GU negative Renal ROS     Musculoskeletal negative musculoskeletal ROS (+)   Abdominal   Peds  Hematology negative hematology ROS (+)   Anesthesia Other Findings LEFT BREAST CANCER  Reproductive/Obstetrics                            Anesthesia Physical Anesthesia Plan  ASA: 2  Anesthesia Plan: Regional and General   Post-op Pain Management: Regional block*   Induction: Intravenous  PONV Risk Score and Plan: 3 and Ondansetron, Dexamethasone, Midazolam and Treatment may vary due to age or medical condition  Airway Management Planned: LMA  Additional Equipment:   Intra-op Plan:   Post-operative Plan: Extubation in OR  Informed Consent: I have reviewed the patients History and Physical, chart, labs and discussed the procedure including the risks, benefits and alternatives for the proposed anesthesia with the patient or authorized representative who has indicated his/her understanding and acceptance.     Dental advisory given  Plan Discussed with: CRNA  Anesthesia Plan Comments:        Anesthesia Quick Evaluation

## 2022-06-04 NOTE — Interval H&P Note (Signed)
History and Physical Interval Note:  06/04/2022 9:31 AM  Jaime Pearson  has presented today for surgery, with the diagnosis of LEFT BREAST CANCER.  The various methods of treatment have been discussed with the patient and family. After consideration of risks, benefits and other options for treatment, the patient has consented to  Procedure(s): LEFT BREAST LUMPECTOMY WITH RADIOACTIVE SEED AND SENTINEL LYMPH NODE BIOPSY (Left) as a surgical intervention.  The patient's history has been reviewed, patient examined, no change in status, stable for surgery.  I have reviewed the patient's chart and labs.  Questions were answered to the patient's satisfaction.     Stark Klein

## 2022-06-04 NOTE — Op Note (Signed)
Left Breast Radioactive seed localized lumpectomy and sentinel lymph node biopsy  Indications: This patient presents with history of screening detected left breast cancer, cT2N0, upper outer quadrant, grade 3 invasive ductal carcinoma, +/+/-  Pre-operative Diagnosis: left breast cancer  Post-operative Diagnosis: Same  Surgeon: Stark Klein   Anesthesia: General endotracheal anesthesia  ASA Class: 2  Procedure Details  The patient was seen in the Holding Room. The risks, benefits, complications, treatment options, and expected outcomes were discussed with the patient. The possibilities of bleeding, infection, the need for additional procedures, failure to diagnose a condition, and creating a complication requiring transfusion or operation were discussed with the patient. The patient concurred with the proposed plan, giving informed consent.  The site of surgery properly noted/marked. The patient was taken to Operating Room # 8, identified, and the procedure verified as Left Breast Seed localized Lumpectomy with sentinel lymph node biopsy. The left arm, breast, and chest were prepped and draped in standard fashion. A Time Out was held and the above information confirmed. The magTrace was injected into the subareolar position and massaged for 5 min  The lumpectomy was performed by creating a superolateral incision near the previously placed radioactive seed.  Dissection was carried down to around the point of maximum signal intensity. The cautery was used to perform the dissection.  Hemostasis was achieved with cautery. The edges of the cavity were marked with large clips, with one each medial, lateral, inferior and superior, and two clips posteriorly.   The specimen was inked with the margin marker paint kit.    Specimen radiography confirmed inclusion of the mammographic lesion, the clip, and the seed.  The background signal in the breast was zero.  The wound was irrigated and closed with 3-0 vicryl  in layers and 4-0 monocryl subcuticular suture.    Using asentimag probe, left axillary sentinel nodes were identified transcutaneously.  An oblique incision was created below the axillary hairline.  Dissection was carried through the clavipectoral fascia.  Two level 2 axillary sentinel nodes were removed.  Counts per second were 5500 and 100.    The background count was 0 cps.  The wound was irrigated.  Hemostasis was achieved with cautery.  The axillary incision was closed with a 3-0 vicryl deep dermal interrupted sutures and a 4-0 monocryl subcuticular closure.    Sterile dressings were applied. At the end of the operation, all sponge, instrument, and needle counts were correct.  Findings: grossly clear surgical margins and no palpable adenopathy, posterior margin is pectoralis.   Estimated Blood Loss:  min         Specimens: left breast tissue with seed and two left axillary sentinel lymph nodes.             Complications:  None; patient tolerated the procedure well.         Disposition: PACU - hemodynamically stable.         Condition: stable

## 2022-06-04 NOTE — Transfer of Care (Signed)
Immediate Anesthesia Transfer of Care Note  Patient: Sharlee Rufino  Procedure(s) Performed: LEFT BREAST LUMPECTOMY WITH RADIOACTIVE SEED AND SENTINEL LYMPH NODE BIOPSY (Left: Breast)  Patient Location: PACU  Anesthesia Type:GA combined with regional for post-op pain  Level of Consciousness: sedated  Airway & Oxygen Therapy: Patient Spontanous Breathing and Patient connected to face mask oxygen  Post-op Assessment: Report given to RN and Post -op Vital signs reviewed and stable  Post vital signs: Reviewed and stable  Last Vitals:  Vitals Value Taken Time  BP 108/68 06/04/22 1145  Temp    Pulse 80 06/04/22 1146  Resp 11 06/04/22 1146  SpO2 97 % 06/04/22 1146  Vitals shown include unvalidated device data.  Last Pain:  Vitals:   06/04/22 0731  TempSrc: Oral  PainSc: 0-No pain      Patients Stated Pain Goal: 3 (19/37/90 2409)  Complications: No notable events documented.

## 2022-06-04 NOTE — Anesthesia Procedure Notes (Signed)
Procedure Name: LMA Insertion Date/Time: 06/04/2022 9:48 AM  Performed by: Willa Frater, CRNAPre-anesthesia Checklist: Patient identified, Emergency Drugs available, Suction available and Patient being monitored Patient Re-evaluated:Patient Re-evaluated prior to induction Oxygen Delivery Method: Circle System Utilized Preoxygenation: Pre-oxygenation with 100% oxygen Induction Type: IV induction Ventilation: Mask ventilation without difficulty LMA: LMA inserted LMA Size: 4.0 Number of attempts: 1 Airway Equipment and Method: bite block Placement Confirmation: positive ETCO2 Tube secured with: Tape Dental Injury: Teeth and Oropharynx as per pre-operative assessment

## 2022-06-04 NOTE — Anesthesia Procedure Notes (Signed)
Anesthesia Regional Block: Pectoralis block   Pre-Anesthetic Checklist: , timeout performed,  Correct Patient, Correct Site, Correct Laterality,  Correct Procedure, Correct Position, site marked,  Risks and benefits discussed,  Surgical consent,  Pre-op evaluation,  At surgeon's request and post-op pain management  Laterality: Left  Prep: chloraprep       Needles:  Injection technique: Single-shot  Needle Type: Echogenic Stimulator Needle     Needle Length: 10cm  Needle Gauge: 20     Additional Needles:   Procedures:,,,, ultrasound used (permanent image in chart),,    Narrative:  Start time: 06/04/2022 8:00 AM End time: 06/04/2022 8:10 AM Injection made incrementally with aspirations every 5 mL.  Performed by: Personally  Anesthesiologist: Murvin Natal, MD  Additional Notes: Functioning IV was confirmed and monitors were applied.  A timeout was performed. Sterile prep, hand hygiene and sterile gloves were used. A 190m 20ga Bbraun echogenic stimulator needle was used. Negative aspiration and negative test dose prior to incremental administration of local anesthetic. The patient tolerated the procedure well.  Ultrasound guidance: relevent anatomy identified, needle position confirmed, local anesthetic spread visualized around nerve(s), vascular puncture avoided.  Image printed for medical record.

## 2022-06-04 NOTE — Anesthesia Postprocedure Evaluation (Signed)
Anesthesia Post Note  Patient: Hanah Moultry  Procedure(s) Performed: LEFT BREAST LUMPECTOMY WITH RADIOACTIVE SEED AND SENTINEL LYMPH NODE BIOPSY (Left: Breast)     Patient location during evaluation: PACU Anesthesia Type: Regional and General Level of consciousness: awake Pain management: pain level controlled Vital Signs Assessment: post-procedure vital signs reviewed and stable Respiratory status: spontaneous breathing, nonlabored ventilation, respiratory function stable and patient connected to nasal cannula oxygen Cardiovascular status: blood pressure returned to baseline and stable Postop Assessment: no apparent nausea or vomiting Anesthetic complications: no   No notable events documented.  Last Vitals:  Vitals:   06/04/22 1215 06/04/22 1302  BP: 123/68 122/70  Pulse: 85 83  Resp: 15 16  Temp:  36.5 C  SpO2: 95% 95%    Last Pain:  Vitals:   06/04/22 1302  TempSrc:   PainSc: 3                  Amelie Caracci P Ahsley Attwood

## 2022-06-05 ENCOUNTER — Encounter (HOSPITAL_BASED_OUTPATIENT_CLINIC_OR_DEPARTMENT_OTHER): Payer: Self-pay | Admitting: General Surgery

## 2022-06-06 LAB — SURGICAL PATHOLOGY

## 2022-06-07 ENCOUNTER — Encounter: Payer: Self-pay | Admitting: General Practice

## 2022-06-07 NOTE — Progress Notes (Signed)
[place] Spiritual Care Note  Followed up by phone for post-op pastoral check-in. Jaime Pearson was in good spirits after hearing the news of clear margins and lymph nodes, a deep joy and relief that she is sharing this morning with people close to her. She has creative practices, such as knitting, that keep her occupied and making meaning, as well as good social support.   Provided empathic listening, emotional support, affirmation of strengths, and reminder of free [employer] healing arts support programming, which may be helpful to her along the way.   Jaime Pearson is aware of ongoing Spiritual Care availability and knows to reach out, especially as she is going through radiation, for additional support as desired.   [place], [place], [facility] Pager [phone] Voicemail [phone]

## 2022-06-10 ENCOUNTER — Telehealth: Payer: Self-pay | Admitting: Genetic Counselor

## 2022-06-10 NOTE — Telephone Encounter (Signed)
I contacted Ms. Bridges-Bledsoe to discuss her genetic testing results. No pathogenic variants were identified in the 77 genes analyzed. Detailed clinic note to follow.  The test report has been scanned into EPIC and is located under the Molecular Pathology section of the Results Review tab.  A portion of the result report is included below for reference.   Lucille Passy, MS, [facility] Genetic Counselor [email or web address] (P) [phone]

## 2022-06-11 ENCOUNTER — Telehealth: Payer: Self-pay | Admitting: *Deleted

## 2022-06-11 ENCOUNTER — Encounter: Payer: Self-pay | Admitting: *Deleted

## 2022-06-11 NOTE — Telephone Encounter (Signed)
Received order for oncotype testing. Requisition faxed to pathology and [facility] °

## 2022-06-12 ENCOUNTER — Encounter: Payer: Self-pay | Admitting: Hematology and Oncology

## 2022-06-14 NOTE — Progress Notes (Signed)
Patient Care Team: Jaime Shorten, MD as PCP - General (Obstetrics and Gynecology) Rockwell Germany, RN as Oncology Nurse Navigator Mauro Kaufmann, RN as Oncology Nurse Navigator Stark Klein, MD as Consulting Physician (General Surgery) Nicholas Lose, MD as Consulting Physician (Hematology and Oncology) Gery Pray, MD as Consulting Physician (Radiation Oncology)  DIAGNOSIS: No diagnosis found.  SUMMARY OF ONCOLOGIC HISTORY: Oncology History  Malignant neoplasm of upper-outer quadrant of left breast in female, estrogen receptor positive ([place])  05/13/2022 Initial Diagnosis   Screening mammogram detected left breast mass at 1 o'clock position measured 2.9 cm.  Axilla negative.  Biopsy revealed grade 3 IDC ER 95% PR 20% HER2 negative Ki-67 40%.   05/22/2022 Cancer Staging   Staging form: Breast, AJCC 8th Edition - Clinical stage from 05/22/2022: Stage IIA (cT2, cN0, cM0, G3, ER+, PR+, HER2-) - Signed by Nicholas Lose, MD on 05/22/2022 Stage prefix: Initial diagnosis Histologic grading system: 3 grade system   06/01/2022 Genetic Testing   [facility] CancerNext-Expanded Panel was Negative.  The report date is June 06, 2022.  The CancerNext-Expanded gene panel offered by [facility] and includes sequencing, rearrangement, and RNA analysis for the following 77 genes: AIP, ALK, APC, ATM, AXIN2, BAP1, BARD1, BLM, BMPR1A, BRCA1, BRCA2, BRIP1, CDC73, CDH1, CDK4, CDKN1B, CDKN2A, CHEK2, CTNNA1, DICER1, FANCC, FH, FLCN, GALNT12, KIF1B, LZTR1, MAX, MEN1, MET, MLH1, MSH2, MSH3, MSH6, MUTYH, NBN, NF1, NF2, NTHL1, PALB2, PHOX2B, PMS2, POT1, PRKAR1A, PTCH1, PTEN, RAD51C, RAD51D, RB1, RECQL, RET, SDHA, SDHAF2, SDHB, SDHC, SDHD, SMAD4, SMARCA4, SMARCB1, SMARCE1, STK11, SUFU, TMEM127, TP53, TSC1, TSC2, VHL and XRCC2 (sequencing and deletion/duplication); EGFR, EGLN1, HOXB13, KIT, MITF, PDGFRA, POLD1, and POLE (sequencing only); EPCAM and GREM1 (deletion/duplication only).      CHIEF COMPLIANT:   INTERVAL  HISTORY: Patrcia Schnepp is a   ALLERGIES:  is allergic to sulfa antibiotics, codeine, and dust mite extract.  MEDICATIONS:  Current Outpatient Medications  Medication Sig Dispense Refill   ALBUTEROL IN Inhale into the lungs.     cetirizine (ZYRTEC) 10 MG tablet Take 10 mg by mouth daily.     FLUoxetine (PROZAC) 40 MG capsule Take 40 mg by mouth daily.     montelukast (SINGULAIR) 10 MG tablet Take 10 mg by mouth at bedtime.     oxyCODONE (OXY IR/ROXICODONE) 5 MG immediate release tablet Take 1 tablet (5 mg total) by mouth every 6 (six) hours as needed for severe pain. 5 tablet 0   No current facility-administered medications for this visit.    PHYSICAL EXAMINATION: ECOG PERFORMANCE STATUS: {CHL ONC ECOG PS:[phone]}  There were no vitals filed for this visit. There were no vitals filed for this visit.  BREAST:*** No palpable masses or nodules in either right or left breasts. No palpable axillary supraclavicular or infraclavicular adenopathy no breast tenderness or nipple discharge. (exam performed in the presence of a chaperone)  LABORATORY DATA:  I have reviewed the data as listed    Latest Ref Rng & Units 05/22/2022   12:27 PM  CMP  Glucose 70 - 99 mg/dL 96   BUN 6 - 20 mg/dL 12   Creatinine 0.44 - 1.00 mg/dL 0.61   Sodium 135 - 145 mmol/L 137   Potassium 3.5 - 5.1 mmol/L 4.1   Chloride 98 - 111 mmol/L 103   CO2 22 - 32 mmol/L 30   Calcium 8.9 - 10.3 mg/dL 9.9   Total Protein 6.5 - 8.1 g/dL 7.2   Total Bilirubin 0.3 - 1.2 mg/dL 0.6   Alkaline  Phos 38 - 126 U/L 88   AST 15 - 41 U/L 15   ALT 0 - 44 U/L 13     Lab Results  Component Value Date   WBC 5.1 05/22/2022   HGB 13.6 05/22/2022   HCT 40.4 05/22/2022   MCV 91.8 05/22/2022   PLT 210 05/22/2022   NEUTROABS 3.3 05/22/2022    ASSESSMENT & PLAN:  No problem-specific Assessment & Plan notes found for this encounter.    No orders of the defined types were placed in this encounter.  The patient  has a good understanding of the overall plan. she agrees with it. she will call with any problems that may develop before the next visit here. Total time spent: 30 mins including face to face time and time spent for planning, charting and co-ordination of care   Suzzette Righter, [place] 06/14/22    I Gardiner Coins am scribing for Dr. Lindi Adie  ***

## 2022-06-18 ENCOUNTER — Inpatient Hospital Stay: Payer: BC Managed Care – PPO | Attending: Hematology and Oncology | Admitting: Hematology and Oncology

## 2022-06-18 ENCOUNTER — Other Ambulatory Visit: Payer: Self-pay

## 2022-06-18 DIAGNOSIS — C50412 Malignant neoplasm of upper-outer quadrant of left female breast: Secondary | ICD-10-CM | POA: Insufficient documentation

## 2022-06-18 DIAGNOSIS — Z170 Estrogen receptor positive status [ER+]: Secondary | ICD-10-CM | POA: Diagnosis not present

## 2022-06-18 NOTE — Assessment & Plan Note (Addendum)
05/13/2022:Screening mammogram detected left breast mass at 1 o'clock position measured 2.9 cm.  Axilla negative.  Biopsy revealed grade 3 IDC ER 95% PR 20% HER2 negative Ki-67 40%.  06/04/2022:Left lumpectomy: Grade 3 IDC 3.3 cm, margins negative, 0/2 lymph nodes negative, ER 95%, PR 20%, HER2 equivocal by IHC negative by FISH ratio 1.34, Ki-67 40% Stage Ib  Pathology counseling: I discussed the final pathology report of the patient provided  a copy of this report. I discussed the margins as well as lymph node surgeries. We also discussed the final staging along with previously performed ER/PR and HER-2/neu testing.  Treatment plan: 1.  Oncotype DX testing has been sent to determine role of chemotherapy 2. adjuvant radiation 3.  Followed by adjuvant antiestrogen therapy  The patient and his wife would like to transfer her care to [facility] because it is closer to their home. I will request our navigators to assist with the transfer.

## 2022-06-19 ENCOUNTER — Encounter (HOSPITAL_COMMUNITY): Payer: Self-pay

## 2022-06-19 ENCOUNTER — Telehealth: Payer: Self-pay | Admitting: *Deleted

## 2022-06-19 ENCOUNTER — Ambulatory Visit: Payer: Self-pay | Admitting: Genetic Counselor

## 2022-06-19 NOTE — Telephone Encounter (Signed)
Faxed referral for med/rad onc at [facility] per patient request. This will closer to home for her.

## 2022-06-19 NOTE — Progress Notes (Signed)
HPI:   Jaime Pearson was previously seen in the [facility] due to a personal and family history of cancer and concerns regarding a hereditary predisposition to cancer. Please refer to our prior cancer genetics clinic note for more information regarding our discussion, assessment and recommendations, at the time. Jaime Pearson recent genetic test results were disclosed to her, as were recommendations warranted by these results. These results and recommendations are discussed in more detail below.  CANCER HISTORY:  Oncology History  Malignant neoplasm of upper-outer quadrant of left breast in female, estrogen receptor positive ([place])  05/13/2022 Initial Diagnosis   Screening mammogram detected left breast mass at 1 o'clock position measured 2.9 cm.  Axilla negative.  Biopsy revealed grade 3 IDC ER 95% PR 20% HER2 negative Ki-67 40%.   05/22/2022 Cancer Staging   Staging form: Breast, AJCC 8th Edition - Clinical stage from 05/22/2022: Stage IIA (cT2, cN0, cM0, G3, ER+, PR+, HER2-) - Signed by Nicholas Lose, MD on 05/22/2022 Stage prefix: Initial diagnosis Histologic grading system: 3 grade system   06/01/2022 Genetic Testing   [facility] CancerNext-Expanded Panel was Negative.  The report date is June 06, 2022.  The CancerNext-Expanded gene panel offered by [facility] and includes sequencing, rearrangement, and RNA analysis for the following 77 genes: AIP, ALK, APC, ATM, AXIN2, BAP1, BARD1, BLM, BMPR1A, BRCA1, BRCA2, BRIP1, CDC73, CDH1, CDK4, CDKN1B, CDKN2A, CHEK2, CTNNA1, DICER1, FANCC, FH, FLCN, GALNT12, KIF1B, LZTR1, MAX, MEN1, MET, MLH1, MSH2, MSH3, MSH6, MUTYH, NBN, NF1, NF2, NTHL1, PALB2, PHOX2B, PMS2, POT1, PRKAR1A, PTCH1, PTEN, RAD51C, RAD51D, RB1, RECQL, RET, SDHA, SDHAF2, SDHB, SDHC, SDHD, SMAD4, SMARCA4, SMARCB1, SMARCE1, STK11, SUFU, TMEM127, TP53, TSC1, TSC2, VHL and XRCC2 (sequencing and deletion/duplication); EGFR, EGLN1, HOXB13, KIT, MITF, PDGFRA, POLD1,  and POLE (sequencing only); EPCAM and GREM1 (deletion/duplication only).    06/04/2022 Surgery   Left lumpectomy: Grade 3 IDC 3.3 cm, margins negative, 0/2 lymph nodes negative, ER 95%, PR 20%, HER2 equivocal by IHC negative by FISH ratio 1.34, Ki-67 40%   06/18/2022 Cancer Staging   Staging form: Breast, AJCC 8th Edition - Pathologic: Stage IB (pT2, pN0, cM0, G3, ER+, PR+, HER2-) - Signed by Nicholas Lose, MD on 06/18/2022 Stage prefix: Initial diagnosis Histologic grading system: 3 grade system     FAMILY HISTORY:  We obtained a detailed, 4-generation family history.  Significant diagnoses are listed below:      Family History  Problem Relation Age of Onset   Prostate cancer Father     Breast cancer Sister 37   Breast cancer Maternal Aunt 64 - 49   Multiple myeloma Maternal Aunt 70 - 79   Leukemia Maternal Aunt 70 - 79   Prostate cancer Paternal Uncle         Jaime Pearson's sister was recently diagnosed with metastatic breast cancer at age 55 and reportedly had negative genetic testing. Her maternal aunt was diagnosed with breast cancer in her 86s. A second maternal aunt was diagnosed with multiple myeloma and leukemia in her 33s. Her maternal great aunt was diagnosed with ovarian cancer in her 34s, she died at age 60.    Jaime Pearson father had prostate cancer identified on his autopsy, he died at age 70. Her paternal uncle was diagnosed with prostate cancer.   GENETIC TEST RESULTS:  The [facility] CancerNext-Expanded Panel found no pathogenic mutations.   The CancerNext-Expanded gene panel offered by [facility] and includes sequencing, rearrangement, and RNA analysis for the following 77 genes: AIP, ALK, APC,  ATM, AXIN2, BAP1, BARD1, BLM, BMPR1A, BRCA1, BRCA2, BRIP1, CDC73, CDH1, CDK4, CDKN1B, CDKN2A, CHEK2, CTNNA1, DICER1, FANCC, FH, FLCN, GALNT12, KIF1B, LZTR1, MAX, MEN1, MET, MLH1, MSH2, MSH3, MSH6, MUTYH, NBN, NF1, NF2, NTHL1, PALB2, PHOX2B, PMS2, POT1, PRKAR1A,  PTCH1, PTEN, RAD51C, RAD51D, RB1, RECQL, RET, SDHA, SDHAF2, SDHB, SDHC, SDHD, SMAD4, SMARCA4, SMARCB1, SMARCE1, STK11, SUFU, TMEM127, TP53, TSC1, TSC2, VHL and XRCC2 (sequencing and deletion/duplication); EGFR, EGLN1, HOXB13, KIT, MITF, PDGFRA, POLD1, and POLE (sequencing only); EPCAM and GREM1 (deletion/duplication only).   The test report has been scanned into EPIC and is located under the Molecular Pathology section of the Results Review tab.  A portion of the result report is included below for reference. Genetic testing reported out on 06/06/2022.       Even though a pathogenic variant was not identified, possible explanations for the cancer in the family may include: There may be no hereditary risk for cancer in the family. The cancers in Jaime Pearson and/or her family may be due to other genetic or environmental factors. There may be a gene mutation in one of these genes that current testing methods cannot detect, but that chance is small. There could be another gene that has not yet been discovered, or that we have not yet tested, that is responsible for the cancer diagnoses in the family.  It is also possible there is a hereditary cause for the cancer in the family that Jaime Pearson did not inherit.  Therefore, it is important to remain in touch with cancer genetics in the future so that we can continue to offer Jaime Pearson the most up to date genetic testing.   ADDITIONAL GENETIC TESTING:  We discussed with Jaime Pearson that her genetic testing was fairly extensive.  If there are genes identified to increase cancer risk that can be analyzed in the future, we would be happy to discuss and coordinate this testing at that time.    CANCER SCREENING RECOMMENDATIONS:  Jaime Pearson test result is considered negative (normal).  This means that we have not identified a hereditary cause for her personal and family history of cancer at this time.   An  individual's cancer risk and medical management are not determined by genetic test results alone. Overall cancer risk assessment incorporates additional factors, including personal medical history, family history, and any available genetic information that may result in a personalized plan for cancer prevention and surveillance. Therefore, it is recommended she continue to follow the cancer management and screening guidelines provided by her oncology and primary healthcare provider.  RECOMMENDATIONS FOR FAMILY MEMBERS:   Individuals in this family might be at some increased risk of developing cancer, over the general population risk, due to the family history of cancer. We recommend women in this family have a yearly mammogram beginning at age 39, or 38 years younger than the earliest onset of cancer, an annual clinical breast exam, and perform monthly breast self-exams.  Other members of the family may still carry a pathogenic variant in one of these genes that Jaime Pearson did not inherit. Based on the family history, we recommend her maternal aunt, who was diagnosed with breast cancer, have genetic counseling and testing.   FOLLOW-UP:  Cancer genetics is a rapidly advancing field and it is possible that new genetic tests will be appropriate for her and/or her family members in the future. We encouraged her to remain in contact with cancer genetics on an annual basis so we can update her personal and family histories  and let her know of advances in cancer genetics that may benefit this family.   Our contact number was provided. Ms. Zingaro questions were answered to her satisfaction, and she knows she is welcome to call us at anytime with additional questions or concerns.   Jaime Passy, MS, [facility] Genetic Counselor [email or web address] (P) [phone]

## 2022-06-20 ENCOUNTER — Encounter: Payer: Self-pay | Admitting: Genetic Counselor

## 2022-06-25 ENCOUNTER — Encounter: Payer: Self-pay | Admitting: *Deleted

## 2022-06-25 ENCOUNTER — Telehealth: Payer: Self-pay | Admitting: *Deleted

## 2022-06-25 ENCOUNTER — Encounter: Payer: Self-pay | Admitting: Hematology and Oncology

## 2022-06-25 ENCOUNTER — Other Ambulatory Visit: Payer: Self-pay | Admitting: General Surgery

## 2022-06-25 NOTE — Telephone Encounter (Signed)
Received oncotype of 32/20%. Spoke to patient and she is aware.  She is transferring her care to [facility] due to be closer for her.  Referral sent and I have spoken with the nurse navigator there Kathlee Nations to inform her of the oncotype results and will fax a copy. She is working on getting her med onc appt. Encouraged patient to call should she have any questions or concerns.

## 2022-06-26 NOTE — Therapy (Signed)
OUTPATIENT PHYSICAL THERAPY BREAST CANCER POST OP FOLLOW UP   Patient Name: Jaime Pearson MRN: [id number] DOB:[birth year], 56 y.o., female Today's Date: 06/27/2022   PT End of Session - 06/27/22 1008     Visit Number 2    Number of Visits 10    Date for PT Re-Evaluation 07/25/22    PT Start Time 1006    PT Stop Time 1115    PT Time Calculation (min) 69 min    Activity Tolerance Patient tolerated treatment well    Behavior During Therapy WFL for tasks assessed/performed             Past Medical History:  Diagnosis Date   Asthma    Breast cancer ([place])    Depression    Family history of adverse reaction to anesthesia    mother prolonged time to wake up   Past Surgical History:  Procedure Laterality Date   BREAST LUMPECTOMY WITH RADIOACTIVE SEED AND SENTINEL LYMPH NODE BIOPSY Left 06/04/2022   Procedure: LEFT BREAST LUMPECTOMY WITH RADIOACTIVE SEED AND SENTINEL LYMPH NODE BIOPSY;  Surgeon: Stark Klein, MD;  Location: [place];  Service: General;  Laterality: Left;   Patient Active Problem List   Diagnosis Date Noted   Genetic testing 06/03/2022   Family history of breast cancer 05/23/2022   Family history of prostate cancer 05/23/2022   Malignant neoplasm of upper-outer quadrant of left breast in female, estrogen receptor positive ([place]) 05/17/2022    REFERRING PROVIDER: Dr. Stark Klein  REFERRING DIAG: Left breast cancer  THERAPY DIAG:  Malignant neoplasm of upper-outer quadrant of left breast in female, estrogen receptor positive ([place])  Abnormal posture  Aftercare following surgery for neoplasm  Rationale for Evaluation and Treatment Rehabilitation  ONSET DATE: 06/04/2022  SUBJECTIVE:                                                                                                                                                                                           SUBJECTIVE STATEMENT: Patient reports she underwent a left  lumpectomy and sentinel node biopsy (2 negative nodes) on 06/04/2022. Her Oncotype score was 32. She has decided to undergo chemotherapy at [facility] to be closer to home. This will be followed by radiation and anti-estrogen therapy.  PERTINENT HISTORY:  Patient was diagnosed on 04/30/2022 with left grade 3 invasive ductal carcinoma breast cancer. It measures 2.9 cm and is located in the upper outer quadrant. It is ER/PR positive and HER2 negative with a Ki67 of 40%.   PATIENT GOALS:  Reassess how my recovery is going related to arm function, pain, and swelling.  PAIN:  Are  you having pain? No  PRECAUTIONS: Recent Surgery, left UE Lymphedema risk  ACTIVITY LEVEL / LEISURE: She is not exercising.   OBJECTIVE:   PATIENT SURVEYS:  QUICK DASH:  Quick Dash - 06/27/22 0001     Open a tight or new jar No difficulty    Do heavy household chores (wash walls, wash floors) Mild difficulty    Carry a shopping bag or briefcase No difficulty    Wash your back No difficulty    Use a knife to cut food No difficulty    Recreational activities in which you take some force or impact through your arm, shoulder, or hand (golf, hammering, tennis) Mild difficulty    During the past week, to what extent has your arm, shoulder or hand problem interfered with your normal social activities with family, friends, neighbors, or groups? Not at all    During the past week, to what extent has your arm, shoulder or hand problem limited your work or other regular daily activities Not at all    Arm, shoulder, or hand pain. Mild    Tingling (pins and needles) in your arm, shoulder, or hand Mild    Difficulty Sleeping Mild difficulty    DASH Score 11.36 %              OBSERVATIONS:  Patient has mild edema and lateral left breast cording present. Incisions appear to be healing well. Breast incision with steri-strips still present.    POSTURE:  Forward head and rounded shoulders posture  LYMPHEDEMA ASSESSMENT:    UPPER EXTREMITY AROM/PROM:   A/PROM RIGHT   eval    Shoulder extension 50  Shoulder flexion 165  Shoulder abduction 171  Shoulder internal rotation 67  Shoulder external rotation 90                          (Blank rows = not tested)   A/PROM LEFT   eval LEFT 06/27/2022  Shoulder extension 43 54  Shoulder flexion 148 137  Shoulder abduction 167 159  Shoulder internal rotation 58 70  Shoulder external rotation 90 90                          (Blank rows = not tested)     CERVICAL AROM: All within normal limits   UPPER EXTREMITY STRENGTH: WNL     LYMPHEDEMA ASSESSMENTS:    LANDMARK RIGHT   eval RIGHT 06/27/2022  10 cm proximal to olecranon process 25.5 25.5  Olecranon process 23.5 22.8  10 cm proximal to ulnar styloid process 21.4 21  Just proximal to ulnar styloid process 14.5 14.8  Across hand at thumb web space 16.3 16.6  At base of 2nd digit 5.7 5.9  (Blank rows = not tested)   LANDMARK LEFT   eval LEFT 06/27/2022  10 cm proximal to olecranon process 25.1 25.5  Olecranon process 22.7 23.1  10 cm proximal to ulnar styloid process 20.3 20.2  Just proximal to ulnar styloid process 13.7 14  Across hand at thumb web space 15.7 16  At base of 2nd digit 5.6 5.9  (Blank rows = not tested)       Surgery type/Date: Left lumpectomy and sentinel node biopsy 06/04/2022 Number of lymph nodes removed: 2 Current/past treatment (chemo, radiation, hormone therapy): none Other symptoms:  Heaviness/tightness No Pain No Pitting edema No Infections No Decreased scar mobility No Stemmer sign No   PATIENT  EDUCATION:  Education details: Closed chain flexion and abduction shoulder exercises Person educated: Patient and Spouse Education method: Explanation, Demonstration, and Verbal cues Education comprehension: verbalized understanding and returned demonstration   HOME EXERCISE PROGRAM:  Reviewed previously given post op HEP. Issued closed chain flexion and  abduction to achieve last few degrees.  ASSESSMENT:  CLINICAL IMPRESSION: Patient is doing well s/p left lumpectomy and sentinel node biopsy. She has some left lateral breast swelling with visible cording (see photo) so a compression foam pad was placed in her bra and she was encouraged to go back to wearing the Hugger bra with the foam to see if that helps. She will benefit from PT to address cording and edema. She lives in [place] so we plan to teach her husband to do the massage. We contacted the lymphedema therapists at [facility] they could see her was 08/09/2022 and the pt did not want to wait that long. Patient had questions/concerns about side effects of chemotherapy and anti-estrogen therapy. We discussed vaginal dryness and stenosis and PT consulted with pelvic floor PT in the building to issue some safe lubricants that do not contain any hormones. Information was issued to pt about that as well and she and her husband verbalized understanding.   Pt will benefit from skilled therapeutic intervention to improve on the following deficits: Decreased knowledge of precautions, impaired UE functional use, pain, decreased ROM, postural dysfunction.   PT treatment/interventions: ADL/Self care home management, Therapeutic exercises, Therapeutic activity, Patient/Family education, DME instructions, Manual lymph drainage, Compression bandaging, scar mobilization, Manual therapy, and Re-evaluation     GOALS: Goals reviewed with patient? Yes  LONG TERM GOALS:  (STG=LTG)  GOALS Name Target Date  Goal status  1 Pt will demonstrate she has regained full shoulder ROM and function post operatively compared to baselines.  Baseline: 07/25/2022 MET  2 Patient will report >= 50% reduction in left lateral breast edema and cording. 07/25/2022 INITIAL  3 Patient's husband will demonstrate proper technique for performing breast manual lymph drainage. 07/25/2022 INITIAL  4 Patient will  verbalize good understanding of lymphedema risk reduction practices. 07/25/2022 INITIAL     PLAN: PT FREQUENCY/DURATION: 2x/week for up to 4 weeks  PLAN FOR NEXT SESSION: manual lymph drainage and myofascial release focused on left lateral breast cording. Teach pt's husband when he comes; pt lives in [place] and would like to minimize travel   [facility]  [address]  [place] [address]  [phone]  After Breast Cancer Class It is recommended you attend the ABC class to be educated on lymphedema risk reduction. This class is free of charge and lasts for 1 hour. It is a 1-time class. You will need to download the Webex app either on your phone or computer. We will send you a link the night before or the morning of the class. You should be able to click on that link to join the class. This is not a confidential class. You don't have to turn your camera on, but other participants may be able to see your email address. You are scheduled for September 18th at 12:00.  Scar massage You can begin gentle scar massage to you incision sites. Gently place one hand on the incision and move the skin (without sliding on the skin) in various directions. Do this for a few minutes and then you can gently massage either coconut oil or vitamin E cream into the scars.  Compression garment You should  continue wearing your compression bra until you feel like you no longer have swelling.  Home exercise Program Continue doing the exercises you were given until you feel like you can do them without feeling any tightness at the end.   Walking Program Studies show that 30 minutes of walking per day (fast enough to elevate your heart rate) can significantly reduce the risk of a cancer recurrence. If you can't walk due to other medical reasons, we encourage you to find another activity you could do (like a stationary bike or water exercise).  Posture After breast cancer  surgery, people frequently sit with rounded shoulders posture because it puts their incisions on slack and feels better. If you sit like this and scar tissue forms in that position, you can become very tight and have pain sitting or standing with good posture. Try to be aware of your posture and sit and stand up tall to heal properly.  Follow up PT: It is recommended you return every 3 months for the first 3 years following surgery to be assessed on the SOZO machine for an L-Dex score. This helps prevent clinically significant lymphedema in 95% of patients. These follow up screens are 10 minute appointments that you are not billed for. You are scheduled for November 27th at 9:50.  Annia Friendly, [place] 06/27/22 11:34 AM

## 2022-06-27 ENCOUNTER — Other Ambulatory Visit: Payer: Self-pay

## 2022-06-27 ENCOUNTER — Encounter: Payer: Self-pay | Admitting: Physical Therapy

## 2022-06-27 ENCOUNTER — Ambulatory Visit: Payer: BC Managed Care – PPO | Attending: General Surgery | Admitting: Physical Therapy

## 2022-06-27 DIAGNOSIS — R293 Abnormal posture: Secondary | ICD-10-CM | POA: Diagnosis present

## 2022-06-27 DIAGNOSIS — Z170 Estrogen receptor positive status [ER+]: Secondary | ICD-10-CM | POA: Insufficient documentation

## 2022-06-27 DIAGNOSIS — Z483 Aftercare following surgery for neoplasm: Secondary | ICD-10-CM | POA: Diagnosis present

## 2022-06-27 DIAGNOSIS — C50412 Malignant neoplasm of upper-outer quadrant of left female breast: Secondary | ICD-10-CM | POA: Insufficient documentation

## 2022-06-27 NOTE — Patient Instructions (Addendum)
Closed Chain: Shoulder Abduction / Adduction - on Wall    One hand on wall, step to side and return. Stepping causes shoulder to abduct and adduct. Step __5_ times, holding 5 seconds, _2__ times per day.  http://ss.exer.us/267   Copyright  VHI. All rights reserved.  Closed Chain: Shoulder Flexion / Extension - on Wall    Hands on wall, step backward. Return. Stepping causes shoulder flexion and extension Step __5_ times, holding 5 seconds, _2__ times per day.  http://ss.exer.us/265   Copyright  VHI. All rights reserved.   After Breast Cancer Class It is recommended you attend the ABC class to be educated on lymphedema risk reduction. This class is free of charge and lasts for 1 hour. It is a 1-time class. You will need to download the Webex app either on your phone or computer. We will send you a link the night before or the morning of the class. You should be able to click on that link to join the class. This is not a confidential class. You don't have to turn your camera on, but other participants may be able to see your email address. You are scheduled for September 18th at 12:00.  Scar massage You can begin gentle scar massage to you incision sites. Gently place one hand on the incision and move the skin (without sliding on the skin) in various directions. Do this for a few minutes and then you can gently massage either coconut oil or vitamin E cream into the scars. YOU CAN START SCAR MASSAGE WHEN THE STERI-STRIP COMES OFF.  Compression garment You should continue wearing your compression bra until you feel like you no longer have swelling.  Home exercise Program Continue doing the exercises you were given until you feel like you can do them without feeling any tightness at the end.   Walking Program Studies show that 30 minutes of walking per day (fast enough to elevate your heart rate) can significantly reduce the risk of a cancer recurrence. If you can't walk due to other  medical reasons, we encourage you to find another activity you could do (like a stationary bike or water exercise).  Posture After breast cancer surgery, people frequently sit with rounded shoulders posture because it puts their incisions on slack and feels better. If you sit like this and scar tissue forms in that position, you can become very tight and have pain sitting or standing with good posture. Try to be aware of your posture and sit and stand up tall to heal properly.  Follow up PT: It is recommended you return every 3 months for the first 3 years following surgery to be assessed on the SOZO machine for an L-Dex score. This helps prevent clinically significant lymphedema in 95% of patients. These follow up screens are 10 minute appointments that you are not billed for. You are scheduled for November 27th at 9:50.  Axillary web syndrome (also called cording) can happen after having breast cancer surgery when lymph nodes in the armpit are removed. It presents as if you have a thin cord in your arm and can run from the armpit all the way down into the forearm. If you've had a sentinel node biopsy, the risk is 1-20% and if you've had an axillary lymph node dissection (more than 7 nodes removed), the risk is 36-72%. The ranges vary depending on the research study.  It most often happens 3-4 weeks post-op but can happen sooner or later. There are several possibilities for  what cording actually is. Although no one knows for sure as of yet, it may be related to lymphatics, veins, or other tissue. Sometimes cording resolves on its own but other times it requires physical therapy with a therapist who specializes in lymphedema and/or cancer rehab. Treatment typically involves stretching, manual techniques, and exercise. Sometimes cords get "released" while stretching or during manual treatment and the patient may experience the sensation of a "pop." This may feel strange but it is not dangerous and is a sign  that the cord has released; range of motion may be improved in the process.

## 2022-07-03 ENCOUNTER — Ambulatory Visit: Payer: BC Managed Care – PPO

## 2022-07-03 DIAGNOSIS — Z170 Estrogen receptor positive status [ER+]: Secondary | ICD-10-CM

## 2022-07-03 DIAGNOSIS — Z483 Aftercare following surgery for neoplasm: Secondary | ICD-10-CM

## 2022-07-03 DIAGNOSIS — C50412 Malignant neoplasm of upper-outer quadrant of left female breast: Secondary | ICD-10-CM | POA: Diagnosis not present

## 2022-07-03 DIAGNOSIS — R293 Abnormal posture: Secondary | ICD-10-CM

## 2022-07-03 NOTE — Patient Instructions (Signed)
Self manual lymph drainage:     [facility] [phone] Perform this sequence once a day.  Only give enough pressure to your skin to make the skin move.  Hug yourself.  Do circles at your neck just above your collarbones.  Repeat this 10 times.  Diaphragmatic - Supine   Inhale through nose making navel move out toward hands. Exhale through puckered lips, hands follow navel in. Repeat _5__ times. Rest _10__ seconds between repeats.    Axilla - One at a Time   Using full weight of flat hand and fingers at center of uninvolved armpit, make _10__ in-place circles.   Copyright  VHI. All rights reserved.  LEG: Inguinal Nodes Stimulation   With small finger side of hand against hip crease on involved side, gently perform circles at the crease. Repeat __10_ times.   Copyright  VHI. All rights reserved.  Axilla to Inguinal Nodes - Sweep   On involved side, sweep _4__ times from armpit along side of trunk to hip crease.  Now gently stretch skin from the involved side to the uninvolved side across the chest at the shoulder line.  Repeat that 4 times.  Draw an imaginary diagonal line from upper outer breast through the nipple area toward lower inner breast.  Direct fluid upward and inward from this line toward the pathway across your upper chest .  Do this in three rows to treat all of the upper inner breast tissue, and do each row 3-4x.      Direct fluid to treat all of lower outer breast tissue downward and outward toward  pathway that is aimed at the left groin.  Finish by doing the pathways as described above going from your involved armpit to the same side groin and going across your upper chest from the involved shoulder to the uninvolved shoulder.  Repeat the steps above where you do circles in your left groin and right armpit. Copyright  VHI. All rights reserved.

## 2022-07-03 NOTE — Therapy (Signed)
OUTPATIENT PHYSICAL THERAPY BREAST CANCER TREATMENT NOTE   Patient Name: Jaime Pearson MRN: [id number] DOB:09-15-1966, 56 y.o., female Today's Date: 07/03/2022   PT End of Session - 07/03/22 0908     Visit Number 3    Number of Visits 10    Date for PT Re-Evaluation 07/25/22    PT Start Time 0906    PT Stop Time 1006    PT Time Calculation (min) 60 min    Activity Tolerance Patient tolerated treatment well    Behavior During Therapy WFL for tasks assessed/performed             Past Medical History:  Diagnosis Date   Asthma    Breast cancer ([place])    Depression    Family history of adverse reaction to anesthesia    mother prolonged time to wake up   Past Surgical History:  Procedure Laterality Date   BREAST LUMPECTOMY WITH RADIOACTIVE SEED AND SENTINEL LYMPH NODE BIOPSY Left 06/04/2022   Procedure: LEFT BREAST LUMPECTOMY WITH RADIOACTIVE SEED AND SENTINEL LYMPH NODE BIOPSY;  Surgeon: Stark Klein, MD;  Location: [facility];  Service: General;  Laterality: Left;   Patient Active Problem List   Diagnosis Date Noted   Genetic testing 06/03/2022   Family history of breast cancer 05/23/2022   Family history of prostate cancer 05/23/2022   Malignant neoplasm of upper-outer quadrant of left breast in female, estrogen receptor positive ([place]) 05/17/2022    REFERRING PROVIDER: Dr. Stark Klein  REFERRING DIAG: Left breast cancer  THERAPY DIAG:  Malignant neoplasm of upper-outer quadrant of left breast in female, estrogen receptor positive ([place])  Abnormal posture  Aftercare following surgery for neoplasm  Rationale for Evaluation and Treatment Rehabilitation  ONSET DATE: 06/04/2022  SUBJECTIVE:                                                                                                                                                                                           SUBJECTIVE STATEMENT: I've been wearing the compression bra with  the added foam that Inez Catalina gave me last time and my husband thinks the cording looked a little better the other day.   PERTINENT HISTORY:  Patient was diagnosed on 04/30/2022 with left grade 3 invasive ductal carcinoma breast cancer. It measures 2.9 cm and is located in the upper outer quadrant. It is ER/PR positive and HER2 negative with a Ki67 of 40%.   PATIENT GOALS:  Reassess how my recovery is going related to arm function, pain, and swelling.  PAIN:  Are you having pain? No  PRECAUTIONS: Recent Surgery, left UE Lymphedema risk  ACTIVITY LEVEL /  LEISURE: She is not exercising.   OBJECTIVE:   PATIENT SURVEYS:  QUICK DASH:     OBSERVATIONS:  Patient has mild edema and lateral left breast cording present. Incisions appear to be healing well. Breast incision with steri-strips still present.    POSTURE:  Forward head and rounded shoulders posture  LYMPHEDEMA ASSESSMENT:   UPPER EXTREMITY AROM/PROM:   A/PROM RIGHT   eval    Shoulder extension 50  Shoulder flexion 165  Shoulder abduction 171  Shoulder internal rotation 67  Shoulder external rotation 90                          (Blank rows = not tested)   A/PROM LEFT   eval LEFT 06/27/2022  Shoulder extension 43 54  Shoulder flexion 148 137  Shoulder abduction 167 159  Shoulder internal rotation 58 70  Shoulder external rotation 90 90                          (Blank rows = not tested)     CERVICAL AROM: All within normal limits   UPPER EXTREMITY STRENGTH: WNL     LYMPHEDEMA ASSESSMENTS:    LANDMARK RIGHT   eval RIGHT 06/27/2022  10 cm proximal to olecranon process 25.5 25.5  Olecranon process 23.5 22.8  10 cm proximal to ulnar styloid process 21.4 21  Just proximal to ulnar styloid process 14.5 14.8  Across hand at thumb web space 16.3 16.6  At base of 2nd digit 5.7 5.9  (Blank rows = not tested)   LANDMARK LEFT   eval LEFT 06/27/2022  10 cm proximal to olecranon process 25.1 25.5  Olecranon  process 22.7 23.1  10 cm proximal to ulnar styloid process 20.3 20.2  Just proximal to ulnar styloid process 13.7 14  Across hand at thumb web space 15.7 16  At base of 2nd digit 5.6 5.9  (Blank rows = not tested)       Surgery type/Date: Left lumpectomy and sentinel node biopsy 06/04/2022 Number of lymph nodes removed: 2 Current/past treatment (chemo, radiation, hormone therapy): none Other symptoms:  Heaviness/tightness No Pain No Pitting edema No Infections No Decreased scar mobility No Stemmer sign No  TODAY'S TREATMENT 07/03/22 Manual Therapy MLD: In Supine: Short neck, 5 diaphragmatic breaths, Lt inguinal and Rt axillary anastomosis, then focused on Lt superior aspect of breast incorporating MFR here; then into Rt S/L for further work to lateral aspect of breast redirecting towards her Lt axillo-inguinal anastomosis and then MFR to cording at lateral breast noted in eval picture, then finished retracing steps in supine ending with lymph nodes. Began instructing pt during and had her return demo of some of steps. She was able to return improved demo after hand over hand cuing for lighter pressure and VCs for correct skin stretch without sliding.  MFR: To multiple areas of cording palpated today. Superior breast had noted cording today so MFR over this area during MLD, and then in Rt S/L to lateral breast over area of cording noted at eval.   PATIENT EDUCATION:  Education details: Closed chain flexion and abduction shoulder exercises Person educated: Patient and Spouse Education method: Explanation, Demonstration, and Verbal cues Education comprehension: verbalized understanding and returned demonstration   HOME EXERCISE PROGRAM:  Reviewed previously given post op HEP. Issued closed chain flexion and abduction to achieve last few degrees.  ASSESSMENT:  CLINICAL IMPRESSION: First session of MLD and  MFR. Began instructing pt in this and she was able to return improved demo  after cuing, see above. She is doing ewll with wearing her compression bra with foam issued at eval and can already tell small improvement from this. Her cording seemed some softened by end of session today and pt was instructed in self MFR (or with her husbands assistance) for home. He will be able to come with her to her next appt for education on manual therapy.  Pt will benefit from skilled therapeutic intervention to improve on the following deficits: Decreased knowledge of precautions, impaired UE functional use, pain, decreased ROM, postural dysfunction.   PT treatment/interventions: ADL/Self care home management, Therapeutic exercises, Therapeutic activity, Patient/Family education, DME instructions, Manual lymph drainage, Compression bandaging, scar mobilization, Manual therapy, and Re-evaluation     GOALS: Goals reviewed with patient? Yes  LONG TERM GOALS:  (STG=LTG)  GOALS Name Target Date  Goal status  1 Pt will demonstrate she has regained full shoulder ROM and function post operatively compared to baselines.  Baseline: 07/25/2022 MET  2 Patient will report >= 50% reduction in left lateral breast edema and cording. 07/31/2022 INITIAL  3 Patient's husband will demonstrate proper technique for performing breast manual lymph drainage. 07/31/2022 INITIAL  4 Patient will verbalize good understanding of lymphedema risk reduction practices. 07/31/2022 INITIAL     PLAN: PT FREQUENCY/DURATION: 2x/week for up to 4 weeks  PLAN FOR NEXT SESSION: Cont manual lymph drainage and myofascial release focused on left lateral breast cording. Teach pt's husband when he comes; pt lives in [place] and would like to minimize travel   Collie Siad, [place] 07/03/22 11:38 AM   [facility]  [address], [address]  [place] [address]  [phone]  Self manual lymph drainage:     Cancer Rehab [phone] Perform this sequence once a day.  Only give enough pressure to  your skin to make the skin move.  Hug yourself.  Do circles at your neck just above your collarbones.  Repeat this 10 times.  Diaphragmatic - Supine   Inhale through nose making navel move out toward hands. Exhale through puckered lips, hands follow navel in. Repeat _5__ times. Rest _10__ seconds between repeats.    Axilla - One at a Time   Using full weight of flat hand and fingers at center of uninvolved armpit, make _10__ in-place circles.   Copyright  VHI. All rights reserved.  LEG: Inguinal Nodes Stimulation   With small finger side of hand against hip crease on involved side, gently perform circles at the crease. Repeat __10_ times.   Copyright  VHI. All rights reserved.  Axilla to Inguinal Nodes - Sweep   On involved side, sweep _4__ times from armpit along side of trunk to hip crease.  Now gently stretch skin from the involved side to the uninvolved side across the chest at the shoulder line.  Repeat that 4 times.  Draw an imaginary diagonal line from upper outer breast through the nipple area toward lower inner breast.  Direct fluid upward and inward from this line toward the pathway across your upper chest .  Do this in three rows to treat all of the upper inner breast tissue, and do each row 3-4x.      Direct fluid to treat all of lower outer breast tissue downward and outward toward  pathway that is aimed at the left groin.  Finish by doing the pathways as described above going from your involved  armpit to the same side groin and going across your upper chest from the involved shoulder to the uninvolved shoulder.  Repeat the steps above where you do circles in your left groin and right armpit.

## 2022-07-05 ENCOUNTER — Encounter (HOSPITAL_COMMUNITY): Payer: Self-pay

## 2022-07-09 ENCOUNTER — Ambulatory Visit: Payer: BC Managed Care – PPO

## 2022-07-09 DIAGNOSIS — R293 Abnormal posture: Secondary | ICD-10-CM

## 2022-07-09 DIAGNOSIS — Z483 Aftercare following surgery for neoplasm: Secondary | ICD-10-CM

## 2022-07-09 DIAGNOSIS — C50412 Malignant neoplasm of upper-outer quadrant of left female breast: Secondary | ICD-10-CM | POA: Diagnosis not present

## 2022-07-09 NOTE — Therapy (Signed)
OUTPATIENT PHYSICAL THERAPY BREAST CANCER TREATMENT NOTE   Patient Name: Jaime Pearson MRN: [id number] DOB:11/18/1965, 56 y.o., female Today's Date: 07/09/2022   PT End of Session - 07/09/22 0759     Visit Number 4    Number of Visits 10    Date for PT Re-Evaluation 07/25/22    PT Start Time 0800    PT Stop Time 0900    PT Time Calculation (min) 60 min    Activity Tolerance Patient tolerated treatment well    Behavior During Therapy WFL for tasks assessed/performed             Past Medical History:  Diagnosis Date   Asthma    Breast cancer ([place])    Depression    Family history of adverse reaction to anesthesia    mother prolonged time to wake up   Past Surgical History:  Procedure Laterality Date   BREAST LUMPECTOMY WITH RADIOACTIVE SEED AND SENTINEL LYMPH NODE BIOPSY Left 06/04/2022   Procedure: LEFT BREAST LUMPECTOMY WITH RADIOACTIVE SEED AND SENTINEL LYMPH NODE BIOPSY;  Surgeon: Stark Klein, MD;  Location: [facility];  Service: General;  Laterality: Left;   Patient Active Problem List   Diagnosis Date Noted   Genetic testing 06/03/2022   Family history of breast cancer 05/23/2022   Family history of prostate cancer 05/23/2022   Malignant neoplasm of upper-outer quadrant of left breast in female, estrogen receptor positive ([place]) 05/17/2022    REFERRING PROVIDER: Dr. Stark Klein  REFERRING DIAG: Left breast cancer  THERAPY DIAG:  Malignant neoplasm of upper-outer quadrant of left breast in female, estrogen receptor positive ([place])  Abnormal posture  Aftercare following surgery for neoplasm  Rationale for Evaluation and Treatment Rehabilitation  ONSET DATE: 06/04/2022  SUBJECTIVE:                                                                                                                                                                                           SUBJECTIVE STATEMENT: My husband thinks my breast cording looks a  little better but I can't tell because it's hard to see. I didn't try the MLD yet but I brought my husband to learn as well.   PERTINENT HISTORY:  Patient was diagnosed on 04/30/2022 with left grade 3 invasive ductal carcinoma breast cancer. It measures 2.9 cm and is located in the upper outer quadrant. It is ER/PR positive and HER2 negative with a Ki67 of 40%.   PATIENT GOALS:  Reassess how my recovery is going related to arm function, pain, and swelling.  PAIN:  Are you having pain? No  PRECAUTIONS: Recent Surgery, left UE  Lymphedema risk  ACTIVITY LEVEL / LEISURE: She is not exercising.   OBJECTIVE:   PATIENT SURVEYS:  QUICK DASH:     OBSERVATIONS:  Patient has mild edema and lateral left breast cording present. Incisions appear to be healing well. Breast incision with steri-strips still present.    POSTURE:  Forward head and rounded shoulders posture  LYMPHEDEMA ASSESSMENT:   UPPER EXTREMITY AROM/PROM:   A/PROM RIGHT   eval    Shoulder extension 50  Shoulder flexion 165  Shoulder abduction 171  Shoulder internal rotation 67  Shoulder external rotation 90                          (Blank rows = not tested)   A/PROM LEFT   eval LEFT 06/27/2022  Shoulder extension 43 54  Shoulder flexion 148 137  Shoulder abduction 167 159  Shoulder internal rotation 58 70  Shoulder external rotation 90 90                          (Blank rows = not tested)     CERVICAL AROM: All within normal limits   UPPER EXTREMITY STRENGTH: WNL     LYMPHEDEMA ASSESSMENTS:    LANDMARK RIGHT   eval RIGHT 06/27/2022  10 cm proximal to olecranon process 25.5 25.5  Olecranon process 23.5 22.8  10 cm proximal to ulnar styloid process 21.4 21  Just proximal to ulnar styloid process 14.5 14.8  Across hand at thumb web space 16.3 16.6  At base of 2nd digit 5.7 5.9  (Blank rows = not tested)   LANDMARK LEFT   eval LEFT 06/27/2022  10 cm proximal to olecranon process 25.1 25.5   Olecranon process 22.7 23.1  10 cm proximal to ulnar styloid process 20.3 20.2  Just proximal to ulnar styloid process 13.7 14  Across hand at thumb web space 15.7 16  At base of 2nd digit 5.6 5.9  (Blank rows = not tested)       Surgery type/Date: Left lumpectomy and sentinel node biopsy 06/04/2022 Number of lymph nodes removed: 2 Current/past treatment (chemo, radiation, hormone therapy): none Other symptoms:  Heaviness/tightness No Pain No Pitting edema No Infections No Decreased scar mobility No Stemmer sign No  TODAY'S TREATMENT 07/09/22 Self Care Educated pts husband in basics of anatomy of lymphatic system along with basic principles of MLD answering his questions throughout. He was very engaged and taking notes during session and will be helpful to pt at home, especially if any cording or breast swelling worsens when she undergoes radiation. Manual Therapy MLD: In Supine: Short neck, 5 diaphragmatic breaths, Lt inguinal and Rt axillary lymph nodes, Lt axillo-inguinal and anterior inter-axillary anastomosis then focused on Lt superior aspect of breast incorporating MFR here; then into Rt S/L for further work to lateral aspect of breast redirecting towards her Lt axillo-inguinal anastomosis and then MFR to cording at lateral breast noted in eval picture, then finished retracing steps in supine ending with lymph nodes. Began instructing pt during and had her return demo of some of steps. Pts husband was instructed throughout while therapist was performing and was able to return demo after hand over hand cuing for lighter pressure and VCs for correct skin stretch without sliding. Encouraged pt to give him feedback if his pressure becomes too firm when they are doing this at home. Both did very well with instruction.  MFR: To superior breast cording  during MLD in supine, and then in Rt S/L to lateral breast but this was hard to palpate today in this position as the lateral breast  cording is some improved from eval picture so instead at end of session had pt sitting EOB with Lt arm OH which made mild cording some visible so MFR here briefly but this is much improved; also new cording noted by pt at inferior breast, MFR release here and this popped then cording was much improved after. Instructed husband in this in case her cording returns which they were educated could happen, especially during radiation.  07/03/22 Manual Therapy MLD: In Supine: Short neck, 5 diaphragmatic breaths, Lt inguinal and Rt axillary anastomosis, then focused on Lt superior aspect of breast incorporating MFR here; then into Rt S/L for further work to lateral aspect of breast redirecting towards her Lt axillo-inguinal anastomosis and then MFR to cording at lateral breast noted in eval picture, then finished retracing steps in supine ending with lymph nodes. Began instructing pt during and had her return demo of some of steps. She was able to return improved demo after hand over hand cuing for lighter pressure and VCs for correct skin stretch without sliding.  MFR: To multiple areas of cording palpated today. Superior breast had noted cording today so MFR over this area during MLD, and then in Rt S/L to lateral breast over area of cording noted at eval.   PATIENT EDUCATION:  Education details: Closed chain flexion and abduction shoulder exercises Person educated: Patient and Spouse Education method: Explanation, Demonstration, and Verbal cues Education comprehension: verbalized understanding and returned demonstration   HOME EXERCISE PROGRAM:  Reviewed previously given post op HEP. Issued closed chain flexion and abduction to achieve last few degrees.  ASSESSMENT:  CLINICAL IMPRESSION: Pts husband was present for session today and was instructed in MLD for Lt breast along with how to perform MFR to cording in her Lt upper quadrant. Pts lateral breast cording is much improved from initial picture at  eval. New cord was palpable at inferior aspect of breast and then cord released becoming no longer visible and only mildly palpable after. Pts husband did very well with instruction and will be helpful to pt in her upcoming cancer treatments. Encouraged them perform this every other day at the most for now but may need to do daily when she is undergoing radiation. Pt will be placed on hold for now as she lives in [place] and they both did well with instruction. She knows she can call this clinic to return if needed in upcoming weeks/months of her cancer treatments if her symptoms worsen past what they can manage at home.   Pt will benefit from skilled therapeutic intervention to improve on the following deficits: Decreased knowledge of precautions, impaired UE functional use, pain, decreased ROM, postural dysfunction.   PT treatment/interventions: ADL/Self care home management, Therapeutic exercises, Therapeutic activity, Patient/Family education, DME instructions, Manual lymph drainage, Compression bandaging, scar mobilization, Manual therapy, and Re-evaluation     GOALS: Goals reviewed with patient? Yes  LONG TERM GOALS:  (STG=LTG)  GOALS Name Target Date  Goal status  1 Pt will demonstrate she has regained full shoulder ROM and function post operatively compared to baselines.  Baseline: 07/25/2022 MET  2 Patient will report >= 50% reduction in left lateral breast edema and cording. 08/06/2022 INITIAL  3 Patient's husband will demonstrate proper technique for performing breast manual lymph drainage. 08/06/2022 INITIAL  4 Patient will verbalize good understanding  of lymphedema risk reduction practices. 08/06/2022 INITIAL     PLAN: PT FREQUENCY/DURATION: 2x/week for up to 4 weeks  PLAN FOR NEXT SESSION: On hold for now and will call us if her symptoms worsen with her upcoming cancer treatments; Assess if she returns and cont manual lymph drainage and myofascial release focused on  left lateral breast cording; review with husband prn if he returns. Pt lives in [place] and would like to minimize travel   Collie Siad, [place] 07/09/22 9:56 AM   [facility]  [address]  [place] [address]  [phone]  Self manual lymph drainage:     Cancer Rehab [phone] Perform this sequence once a day.  Only give enough pressure to your skin to make the skin move.  Hug yourself.  Do circles at your neck just above your collarbones.  Repeat this 10 times.  Diaphragmatic - Supine   Inhale through nose making navel move out toward hands. Exhale through puckered lips, hands follow navel in. Repeat _5__ times. Rest _10__ seconds between repeats.    Axilla - One at a Time   Using full weight of flat hand and fingers at center of uninvolved armpit, make _10__ in-place circles.   Copyright  VHI. All rights reserved.  LEG: Inguinal Nodes Stimulation   With small finger side of hand against hip crease on involved side, gently perform circles at the crease. Repeat __10_ times.   Copyright  VHI. All rights reserved.  Axilla to Inguinal Nodes - Sweep   On involved side, sweep _4__ times from armpit along side of trunk to hip crease.  Now gently stretch skin from the involved side to the uninvolved side across the chest at the shoulder line.  Repeat that 4 times.  Draw an imaginary diagonal line from upper outer breast through the nipple area toward lower inner breast.  Direct fluid upward and inward from this line toward the pathway across your upper chest .  Do this in three rows to treat all of the upper inner breast tissue, and do each row 3-4x.      Direct fluid to treat all of lower outer breast tissue downward and outward toward  pathway that is aimed at the left groin.  Finish by doing the pathways as described above going from your involved armpit to the same side groin and going across your upper chest from the involved  shoulder to the uninvolved shoulder.  Repeat the steps above where you do circles in your left groin and right armpit.

## 2022-08-05 ENCOUNTER — Encounter: Payer: Self-pay | Admitting: Physical Therapy

## 2022-09-09 ENCOUNTER — Ambulatory Visit: Payer: BC Managed Care – PPO | Attending: General Surgery

## 2022-09-09 VITALS — Wt 144.5 lb

## 2022-09-09 DIAGNOSIS — Z483 Aftercare following surgery for neoplasm: Secondary | ICD-10-CM | POA: Insufficient documentation

## 2022-09-09 NOTE — Therapy (Signed)
  OUTPATIENT PHYSICAL THERAPY SOZO SCREENING NOTE   Patient Name: Jaime Pearson MRN: [id number] DOB:[birth year], 55 y.o., female Today's Date: 09/09/2022  PCP: Louretta Shorten, MD REFERRING PROVIDER: Stark Klein, MD   PT End of Session - 09/09/22 0946     Visit Number 4   # unchanged due to screen only   PT Start Time 0944    PT Stop Time 0952    PT Time Calculation (min) 8 min    Activity Tolerance Patient tolerated treatment well    Behavior During Therapy WFL for tasks assessed/performed             Past Medical History:  Diagnosis Date   Asthma    Breast cancer ([place])    Depression    Family history of adverse reaction to anesthesia    mother prolonged time to wake up   Past Surgical History:  Procedure Laterality Date   BREAST LUMPECTOMY WITH RADIOACTIVE SEED AND SENTINEL LYMPH NODE BIOPSY Left 06/04/2022   Procedure: LEFT BREAST LUMPECTOMY WITH RADIOACTIVE SEED AND SENTINEL LYMPH NODE BIOPSY;  Surgeon: Stark Klein, MD;  Location: [place];  Service: General;  Laterality: Left;   Patient Active Problem List   Diagnosis Date Noted   Genetic testing 06/03/2022   Family history of breast cancer 05/23/2022   Family history of prostate cancer 05/23/2022   Malignant neoplasm of upper-outer quadrant of left breast in female, estrogen receptor positive ([place]) 05/17/2022    REFERRING DIAG: left breast cancer at risk for lymphedema  THERAPY DIAG: Aftercare following surgery for neoplasm  PERTINENT HISTORY: Patient was diagnosed on 04/30/2022 with left grade 3 invasive ductal carcinoma breast cancer. It measures 2.9 cm and is located in the upper outer quadrant. It is ER/PR positive and HER2 negative with a Ki67 of 40%.   PRECAUTIONS: left UE Lymphedema risk, None  SUBJECTIVE: Pt returns for her first 3 month L-Dex screen.   PAIN:  Are you having pain? No  SOZO SCREENING: Patient was assessed today using the SOZO machine to determine the  lymphedema index score. This was compared to her baseline score. It was determined that she is within the recommended range when compared to her baseline and no further action is needed at this time. She will continue SOZO screenings. These are done every 3 months for 2 years post operatively followed by every 6 months for 2 years, and then annually.  L-DEX FLOWSHEETS - 09/09/22 0900       L-DEX LYMPHEDEMA SCREENING   Measurement Type Unilateral    L-DEX MEASUREMENT EXTREMITY Upper Extremity    POSITION  Standing    DOMINANT SIDE Right    At Risk Side Left    BASELINE SCORE (UNILATERAL) -10.8    L-DEX SCORE (UNILATERAL) -4.8    VALUE CHANGE (UNILAT) 6               Otelia Limes, PTA 09/09/2022, 9:53 AM

## 2022-09-25 ENCOUNTER — Other Ambulatory Visit: Payer: Self-pay | Admitting: General Surgery

## 2022-09-25 DIAGNOSIS — C50419 Malignant neoplasm of upper-outer quadrant of unspecified female breast: Secondary | ICD-10-CM

## 2022-10-09 ENCOUNTER — Ambulatory Visit
Admission: RE | Admit: 2022-10-09 | Discharge: 2022-10-09 | Disposition: A | Discharge status: Home / Self Care | Payer: BC Managed Care – PPO | Source: Ambulatory Visit | Attending: General Surgery | Admitting: General Surgery

## 2022-10-09 DIAGNOSIS — C50419 Malignant neoplasm of upper-outer quadrant of unspecified female breast: Secondary | ICD-10-CM

## 2022-10-09 MED ORDER — GADOPICLENOL 0.5 MMOL/ML IV SOLN
7.0000 mL | Freq: Once | INTRAVENOUS | Status: AC | PRN
  Administered 2022-10-09: 7 mL via INTRAVENOUS

## 2022-12-09 ENCOUNTER — Ambulatory Visit: Payer: BC Managed Care – PPO | Attending: General Surgery

## 2022-12-09 VITALS — Wt 146.0 lb

## 2022-12-09 DIAGNOSIS — Z483 Aftercare following surgery for neoplasm: Secondary | ICD-10-CM | POA: Insufficient documentation

## 2022-12-09 NOTE — Therapy (Signed)
  OUTPATIENT PHYSICAL THERAPY SOZO SCREENING NOTE   Patient Name: Jaime Pearson MRN: [id number] DOB:[birth year], 57 y.o., female Today's Date: 12/09/2022  PCP: Louretta Shorten, MD REFERRING PROVIDER: Stark Klein, MD   PT End of Session - 12/09/22 1518     Visit Number 4   # unchanged due to screen only   PT Start Time [id number]    PT Stop Time 1520    PT Time Calculation (min) 4 min    Activity Tolerance Patient tolerated treatment well    Behavior During Therapy WFL for tasks assessed/performed             Past Medical History:  Diagnosis Date   Asthma    Breast cancer ([place])    Depression    Family history of adverse reaction to anesthesia    mother prolonged time to wake up   Past Surgical History:  Procedure Laterality Date   BREAST LUMPECTOMY WITH RADIOACTIVE SEED AND SENTINEL LYMPH NODE BIOPSY Left 06/04/2022   Procedure: LEFT BREAST LUMPECTOMY WITH RADIOACTIVE SEED AND SENTINEL LYMPH NODE BIOPSY;  Surgeon: Stark Klein, MD;  Location: [place];  Service: General;  Laterality: Left;   Patient Active Problem List   Diagnosis Date Noted   Genetic testing 06/03/2022   Family history of breast cancer 05/23/2022   Family history of prostate cancer 05/23/2022   Malignant neoplasm of upper-outer quadrant of left breast in female, estrogen receptor positive ([place]) 05/17/2022    REFERRING DIAG: left breast cancer at risk for lymphedema  THERAPY DIAG: Aftercare following surgery for neoplasm  PERTINENT HISTORY: Patient was diagnosed on 04/30/2022 with left grade 3 invasive ductal carcinoma breast cancer. It measures 2.9 cm and is located in the upper outer quadrant. It is ER/PR positive and HER2 negative with a Ki67 of 40%.   PRECAUTIONS: left UE Lymphedema risk, None  SUBJECTIVE: Pt returns for her 3 month L-Dex screen.   PAIN:  Are you having pain? No  SOZO SCREENING: Patient was assessed today using the SOZO machine to determine the  lymphedema index score. This was compared to her baseline score. It was determined that she is within the recommended range when compared to her baseline and no further action is needed at this time. She will continue SOZO screenings. These are done every 3 months for 2 years post operatively followed by every 6 months for 2 years, and then annually.  L-DEX FLOWSHEETS - 12/09/22 1500       L-DEX LYMPHEDEMA SCREENING   Measurement Type Unilateral    L-DEX MEASUREMENT EXTREMITY Upper Extremity    POSITION  Standing    DOMINANT SIDE Right    At Risk Side Left    BASELINE SCORE (UNILATERAL) -10.8    L-DEX SCORE (UNILATERAL) -7.3    VALUE CHANGE (UNILAT) 3.5               Otelia Limes, PTA 12/09/2022, 3:19 PM

## 2023-03-03 ENCOUNTER — Ambulatory Visit: Payer: BC Managed Care – PPO | Attending: General Surgery

## 2023-03-03 VITALS — Wt 146.0 lb

## 2023-03-03 DIAGNOSIS — Z483 Aftercare following surgery for neoplasm: Secondary | ICD-10-CM

## 2023-03-03 NOTE — Therapy (Signed)
  OUTPATIENT PHYSICAL THERAPY SOZO SCREENING NOTE   Patient Name: Jaime Pearson MRN: [id number] DOB:August 10, 1966, 57 y.o., female Today's Date: 03/03/2023  PCP: Candice Camp, MD REFERRING PROVIDER: Almond Lint, MD   PT End of Session - 03/03/23 1612     Visit Number 4   # unchanged due to screen only   PT Start Time 1611    PT Stop Time 1614    PT Time Calculation (min) 3 min    Activity Tolerance Patient tolerated treatment well    Behavior During Therapy WFL for tasks assessed/performed             Past Medical History:  Diagnosis Date   Asthma    Breast cancer ([facility])    Depression    Family history of adverse reaction to anesthesia    mother prolonged time to wake up   Past Surgical History:  Procedure Laterality Date   BREAST LUMPECTOMY WITH RADIOACTIVE SEED AND SENTINEL LYMPH NODE BIOPSY Left 06/04/2022   Procedure: LEFT BREAST LUMPECTOMY WITH RADIOACTIVE SEED AND SENTINEL LYMPH NODE BIOPSY;  Surgeon: Almond Lint, MD;  Location: [facility];  Service: General;  Laterality: Left;   Patient Active Problem List   Diagnosis Date Noted   Genetic testing 06/03/2022   Family history of breast cancer 05/23/2022   Family history of prostate cancer 05/23/2022   Malignant neoplasm of upper-outer quadrant of left breast in female, estrogen receptor positive ([facility]) 05/17/2022    REFERRING DIAG: left breast cancer at risk for lymphedema  THERAPY DIAG: Aftercare following surgery for neoplasm  PERTINENT HISTORY: Patient was diagnosed on 04/30/2022 with left grade 3 invasive ductal carcinoma breast cancer. It measures 2.9 cm and is located in the upper outer quadrant. It is ER/PR positive and HER2 negative with a Ki67 of 40%.   PRECAUTIONS: left UE Lymphedema risk, None  SUBJECTIVE: Pt returns for her 3 month L-Dex screen.   PAIN:  Are you having pain? No  SOZO SCREENING: Patient was assessed today using the SOZO machine to determine the  lymphedema index score. This was compared to her baseline score. It was determined that she is within the recommended range when compared to her baseline and no further action is needed at this time. She will continue SOZO screenings. These are done every 3 months for 2 years post operatively followed by every 6 months for 2 years, and then annually.  L-DEX FLOWSHEETS - 03/03/23 1600       L-DEX LYMPHEDEMA SCREENING   Measurement Type Unilateral    L-DEX MEASUREMENT EXTREMITY Upper Extremity    POSITION  Standing    DOMINANT SIDE Right    At Risk Side Left    BASELINE SCORE (UNILATERAL) -10.8    L-DEX SCORE (UNILATERAL) -5    VALUE CHANGE (UNILAT) 5.8               Hermenia Bers, PTA 03/03/2023, 4:14 PM

## 2023-05-19 ENCOUNTER — Ambulatory Visit: Payer: BC Managed Care – PPO | Attending: General Surgery

## 2023-05-19 VITALS — Wt 143.5 lb

## 2023-05-19 DIAGNOSIS — Z483 Aftercare following surgery for neoplasm: Secondary | ICD-10-CM | POA: Insufficient documentation

## 2023-05-19 NOTE — Therapy (Signed)
  OUTPATIENT PHYSICAL THERAPY SOZO SCREENING NOTE   Patient Name: Jaime Pearson MRN: [id number] DOB:1965/11/18, 57 y.o., female Today's Date: 05/19/2023  PCP: Candice Camp, MD REFERRING PROVIDER: Almond Lint, MD   PT End of Session - 05/19/23 1605     Visit Number 4   # unchanged due to screen only   PT Start Time 1603    PT Stop Time 1608    PT Time Calculation (min) 5 min    Activity Tolerance Patient tolerated treatment well    Behavior During Therapy WFL for tasks assessed/performed             Past Medical History:  Diagnosis Date   Asthma    Breast cancer ([facility])    Depression    Family history of adverse reaction to anesthesia    mother prolonged time to wake up   Past Surgical History:  Procedure Laterality Date   BREAST LUMPECTOMY WITH RADIOACTIVE SEED AND SENTINEL LYMPH NODE BIOPSY Left 06/04/2022   Procedure: LEFT BREAST LUMPECTOMY WITH RADIOACTIVE SEED AND SENTINEL LYMPH NODE BIOPSY;  Surgeon: Almond Lint, MD;  Location: [facility];  Service: General;  Laterality: Left;   Patient Active Problem List   Diagnosis Date Noted   Genetic testing 06/03/2022   Family history of breast cancer 05/23/2022   Family history of prostate cancer 05/23/2022   Malignant neoplasm of upper-outer quadrant of left breast in female, estrogen receptor positive ([facility]) 05/17/2022    REFERRING DIAG: left breast cancer at risk for lymphedema  THERAPY DIAG: Aftercare following surgery for neoplasm  PERTINENT HISTORY: Patient was diagnosed on 04/30/2022 with left grade 3 invasive ductal carcinoma breast cancer. It measures 2.9 cm and is located in the upper outer quadrant. It is ER/PR positive and HER2 negative with a Ki67 of 40%.   PRECAUTIONS: left UE Lymphedema risk, None  SUBJECTIVE: Pt returns for her 3 month L-Dex screen.   PAIN:  Are you having pain? No  SOZO SCREENING: Patient was assessed today using the SOZO machine to determine the lymphedema  index score. This was compared to her baseline score. It was determined that she is within the recommended range when compared to her baseline and no further action is needed at this time. She will continue SOZO screenings. These are done every 3 months for 2 years post operatively followed by every 6 months for 2 years, and then annually.  L-DEX FLOWSHEETS - 05/19/23 1600       L-DEX LYMPHEDEMA SCREENING   Measurement Type Unilateral    L-DEX MEASUREMENT EXTREMITY Upper Extremity    POSITION  Standing    DOMINANT SIDE Right    At Risk Side Left    BASELINE SCORE (UNILATERAL) -10.8    L-DEX SCORE (UNILATERAL) -9.6    VALUE CHANGE (UNILAT) 1.2               Hermenia Bers, PTA 05/19/2023, 4:09 PM

## 2023-07-03 ENCOUNTER — Other Ambulatory Visit: Payer: Self-pay | Admitting: Obstetrics and Gynecology

## 2023-07-03 DIAGNOSIS — Z9889 Other specified postprocedural states: Secondary | ICD-10-CM

## 2023-07-04 ENCOUNTER — Other Ambulatory Visit: Payer: Self-pay | Admitting: Obstetrics and Gynecology

## 2023-07-04 DIAGNOSIS — Z170 Estrogen receptor positive status [ER+]: Secondary | ICD-10-CM

## 2023-07-10 ENCOUNTER — Ambulatory Visit
Admission: RE | Admit: 2023-07-10 | Discharge: 2023-07-10 | Disposition: A | Discharge status: Home / Self Care | Payer: BC Managed Care – PPO | Source: Ambulatory Visit | Attending: Obstetrics and Gynecology | Admitting: Obstetrics and Gynecology

## 2023-07-10 DIAGNOSIS — Z170 Estrogen receptor positive status [ER+]: Secondary | ICD-10-CM

## 2023-07-10 HISTORY — DX: Personal history of antineoplastic chemotherapy: Z92.21

## 2023-07-10 HISTORY — DX: Personal history of irradiation: Z92.3

## 2023-08-25 ENCOUNTER — Ambulatory Visit: Payer: BC Managed Care – PPO | Attending: General Surgery

## 2023-08-25 VITALS — Wt 141.2 lb

## 2023-08-25 DIAGNOSIS — Z483 Aftercare following surgery for neoplasm: Secondary | ICD-10-CM | POA: Insufficient documentation

## 2023-08-25 NOTE — Therapy (Signed)
  OUTPATIENT PHYSICAL THERAPY SOZO SCREENING NOTE   Patient Name: Jaime Pearson MRN: [id number] DOB:[birth year], 57 y.o., female Today's Date: 08/25/2023  PCP: Candice Camp, MD REFERRING PROVIDER: Almond Lint, MD   PT End of Session - 08/25/23 1622     Visit Number 4   # unchanged due to screen only   PT Start Time 1620    PT Stop Time 1624    PT Time Calculation (min) 4 min    Activity Tolerance Patient tolerated treatment well    Behavior During Therapy WFL for tasks assessed/performed             Past Medical History:  Diagnosis Date   Asthma    Breast cancer ([facility])    Depression    Family history of adverse reaction to anesthesia    mother prolonged time to wake up   Personal history of chemotherapy    Personal history of radiation therapy    Past Surgical History:  Procedure Laterality Date   BREAST LUMPECTOMY WITH RADIOACTIVE SEED AND SENTINEL LYMPH NODE BIOPSY Left 06/04/2022   Procedure: LEFT BREAST LUMPECTOMY WITH RADIOACTIVE SEED AND SENTINEL LYMPH NODE BIOPSY;  Surgeon: Almond Lint, MD;  Location: [facility];  Service: General;  Laterality: Left;   Patient Active Problem List   Diagnosis Date Noted   Genetic testing 06/03/2022   Family history of breast cancer 05/23/2022   Family history of prostate cancer 05/23/2022   Malignant neoplasm of upper-outer quadrant of left breast in female, estrogen receptor positive ([facility]) 05/17/2022    REFERRING DIAG: left breast cancer at risk for lymphedema  THERAPY DIAG: Aftercare following surgery for neoplasm  PERTINENT HISTORY: Patient was diagnosed on 04/30/2022 with left grade 3 invasive ductal carcinoma breast cancer. It measures 2.9 cm and is located in the upper outer quadrant. It is ER/PR positive and HER2 negative with a Ki67 of 40%.   PRECAUTIONS: left UE Lymphedema risk, None  SUBJECTIVE: Pt returns for her 3 month L-Dex screen.   PAIN:  Are you having pain? No  SOZO  SCREENING: Patient was assessed today using the SOZO machine to determine the lymphedema index score. This was compared to her baseline score. It was determined that she is within the recommended range when compared to her baseline and no further action is needed at this time. She will continue SOZO screenings. These are done every 3 months for 2 years post operatively followed by every 6 months for 2 years, and then annually.  L-DEX FLOWSHEETS - 08/25/23 1600       L-DEX LYMPHEDEMA SCREENING   Measurement Type Unilateral    L-DEX MEASUREMENT EXTREMITY Upper Extremity    POSITION  Standing    DOMINANT SIDE Right    At Risk Side Left    BASELINE SCORE (UNILATERAL) -10.8    L-DEX SCORE (UNILATERAL) -8    VALUE CHANGE (UNILAT) 2.8               Hermenia Bers, PTA 08/25/2023, 4:25 PM

## 2023-12-04 NOTE — Therapy (Signed)
  OUTPATIENT PHYSICAL THERAPY SOZO SCREENING NOTE   Patient Name: Jaime Pearson MRN: [id number] DOB:[birth year], 58 y.o., female Today's Date: 12/08/2023  PCP: Candice Camp, MD REFERRING PROVIDER: Almond Lint, MD   PT End of Session - 12/08/23 1503     Visit Number 4   unchanged due to screen only   PT Start Time 1504    PT Stop Time 1506    PT Time Calculation (min) 2 min              Past Medical History:  Diagnosis Date   Asthma    Breast cancer ([facility])    Depression    Family history of adverse reaction to anesthesia    mother prolonged time to wake up   Personal history of chemotherapy    Personal history of radiation therapy    Past Surgical History:  Procedure Laterality Date   BREAST LUMPECTOMY WITH RADIOACTIVE SEED AND SENTINEL LYMPH NODE BIOPSY Left 06/04/2022   Procedure: LEFT BREAST LUMPECTOMY WITH RADIOACTIVE SEED AND SENTINEL LYMPH NODE BIOPSY;  Surgeon: Almond Lint, MD;  Location: [facility];  Service: General;  Laterality: Left;   Patient Active Problem List   Diagnosis Date Noted   Genetic testing 06/03/2022   Family history of breast cancer 05/23/2022   Family history of prostate cancer 05/23/2022   Malignant neoplasm of upper-outer quadrant of left breast in female, estrogen receptor positive ([facility]) 05/17/2022    REFERRING DIAG: left breast cancer at risk for lymphedema  THERAPY DIAG: Malignant neoplasm of upper-outer quadrant of left breast in female, estrogen receptor positive ([facility])  PERTINENT HISTORY: Patient was diagnosed on 04/30/2022 with left grade 3 invasive ductal carcinoma breast cancer. It measures 2.9 cm and is located in the upper outer quadrant. It is ER/PR positive and HER2 negative with a Ki67 of 40%.   PRECAUTIONS: left UE Lymphedema risk, None  SUBJECTIVE: Pt returns for her 3 month L-Dex screen.   PAIN:  Are you having pain? No  SOZO SCREENING: Patient was assessed today using the SOZO machine to  determine the lymphedema index score. This was compared to her baseline score. It was determined that she is within the recommended range when compared to her baseline and no further action is needed at this time. She will continue SOZO screenings. These are done every 3 months for 2 years post operatively followed by every 6 months for 2 years, and then annually.  L-DEX FLOWSHEETS - 12/08/23 1500       L-DEX LYMPHEDEMA SCREENING   Measurement Type Unilateral    L-DEX MEASUREMENT EXTREMITY Upper Extremity    POSITION  Standing    DOMINANT SIDE Right    At Risk Side Left    BASELINE SCORE (UNILATERAL) -10.8    L-DEX SCORE (UNILATERAL) -6.4    VALUE CHANGE (UNILAT) 4.4             Educated pt on signs and symptoms of lymphedema   [employer], PT 12/08/2023, 3:07 PM

## 2023-12-08 ENCOUNTER — Ambulatory Visit: Payer: 59 | Attending: General Surgery | Admitting: Physical Therapy

## 2023-12-08 DIAGNOSIS — C50412 Malignant neoplasm of upper-outer quadrant of left female breast: Secondary | ICD-10-CM | POA: Insufficient documentation

## 2023-12-08 DIAGNOSIS — Z170 Estrogen receptor positive status [ER+]: Secondary | ICD-10-CM | POA: Insufficient documentation

## 2024-03-22 ENCOUNTER — Ambulatory Visit: Payer: 59 | Attending: General Surgery

## 2024-03-22 VITALS — Wt 139.4 lb

## 2024-03-22 DIAGNOSIS — Z483 Aftercare following surgery for neoplasm: Secondary | ICD-10-CM | POA: Insufficient documentation

## 2024-03-22 NOTE — Therapy (Signed)
  OUTPATIENT PHYSICAL THERAPY SOZO SCREENING NOTE   Patient Name: Jaime Pearson MRN: [id number] DOB:[birth year], 58 y.o., female Today's Date: 03/22/2024  PCP: Belle Box, MD REFERRING PROVIDER: Lockie Rima, MD   PT End of Session - 03/22/24 1513     Visit Number 4   # unchnaged due to screen only   PT Start Time 1511    PT Stop Time 1515    PT Time Calculation (min) 4 min    Activity Tolerance Patient tolerated treatment well    Behavior During Therapy WFL for tasks assessed/performed              Past Medical History:  Diagnosis Date   Asthma    Breast cancer ([facility])    Depression    Family history of adverse reaction to anesthesia    mother prolonged time to wake up   Personal history of chemotherapy    Personal history of radiation therapy    Past Surgical History:  Procedure Laterality Date   BREAST LUMPECTOMY WITH RADIOACTIVE SEED AND SENTINEL LYMPH NODE BIOPSY Left 06/04/2022   Procedure: LEFT BREAST LUMPECTOMY WITH RADIOACTIVE SEED AND SENTINEL LYMPH NODE BIOPSY;  Surgeon: Lockie Rima, MD;  Location: [facility];  Service: General;  Laterality: Left;   Patient Active Problem List   Diagnosis Date Noted   Genetic testing 06/03/2022   Family history of breast cancer 05/23/2022   Family history of prostate cancer 05/23/2022   Malignant neoplasm of upper-outer quadrant of left breast in female, estrogen receptor positive ([facility]) 05/17/2022    REFERRING DIAG: left breast cancer at risk for lymphedema  THERAPY DIAG: Aftercare following surgery for neoplasm  PERTINENT HISTORY: Patient was diagnosed on 04/30/2022 with left grade 3 invasive ductal carcinoma breast cancer. It measures 2.9 cm and is located in the upper outer quadrant. It is ER/PR positive and HER2 negative with a Ki67 of 40%.   PRECAUTIONS: left UE Lymphedema risk, None  SUBJECTIVE: Pt returns for her 3 month L-Dex screen.   PAIN:  Are you having pain? No  SOZO  SCREENING: Patient was assessed today using the SOZO machine to determine the lymphedema index score. This was compared to her baseline score. It was determined that she is within the recommended range when compared to her baseline and no further action is needed at this time. She will continue SOZO screenings. These are done every 3 months for 2 years post operatively followed by every 6 months for 2 years, and then annually.  L-DEX FLOWSHEETS - 03/22/24 1500       L-DEX LYMPHEDEMA SCREENING   Measurement Type Unilateral    L-DEX MEASUREMENT EXTREMITY Upper Extremity    POSITION  Standing    DOMINANT SIDE Right    At Risk Side Left    BASELINE SCORE (UNILATERAL) -10.8    L-DEX SCORE (UNILATERAL) -6.5    VALUE CHANGE (UNILAT) 4.3              Denyce Flank, PTA 03/22/2024, 3:14 PM

## 2024-06-21 ENCOUNTER — Ambulatory Visit: Attending: General Surgery

## 2024-06-21 VITALS — Wt 139.5 lb

## 2024-06-21 DIAGNOSIS — Z483 Aftercare following surgery for neoplasm: Secondary | ICD-10-CM | POA: Insufficient documentation

## 2024-06-21 NOTE — Therapy (Signed)
 OUTPATIENT PHYSICAL THERAPY SOZO SCREENING NOTE   Patient Name: Jaime Pearson MRN: [id number] DOB:Mar 04, 1966, 58 y.o., female Today's Date: 06/21/2024  PCP: Marget Lenis, MD REFERRING PROVIDER: Aron Shoulders, MD   PT End of Session - 06/21/24 1549     Visit Number 4   # unchanged due to screen only   PT Start Time 1547    PT Stop Time 1555    PT Time Calculation (min) 8 min    Activity Tolerance Patient tolerated treatment well    Behavior During Therapy WFL for tasks assessed/performed           Past Medical History:  Diagnosis Date   Asthma    Breast cancer ([facility])    Depression    Family history of adverse reaction to anesthesia    mother prolonged time to wake up   Personal history of chemotherapy    Personal history of radiation therapy    Past Surgical History:  Procedure Laterality Date   BREAST LUMPECTOMY WITH RADIOACTIVE SEED AND SENTINEL LYMPH NODE BIOPSY Left 06/04/2022   Procedure: LEFT BREAST LUMPECTOMY WITH RADIOACTIVE SEED AND SENTINEL LYMPH NODE BIOPSY;  Surgeon: Aron Shoulders, MD;  Location: [facility];  Service: General;  Laterality: Left;   Patient Active Problem List   Diagnosis Date Noted   Genetic testing 06/03/2022   Family history of breast cancer 05/23/2022   Family history of prostate cancer 05/23/2022   Malignant neoplasm of upper-outer quadrant of left breast in female, estrogen receptor positive ([facility]) 05/17/2022    REFERRING DIAG: left breast cancer at risk for lymphedema  THERAPY DIAG: Aftercare following surgery for neoplasm  PERTINENT HISTORY: Patient was diagnosed on 04/30/2022 with left grade 3 invasive ductal carcinoma breast cancer. It measures 2.9 cm and is located in the upper outer quadrant. It is ER/PR positive and HER2 negative with a Ki67 of 40%.   PRECAUTIONS: left UE Lymphedema risk, None  SUBJECTIVE: Pt returns for her 3 month L-Dex screen.   PAIN:  Are you having pain? No  SOZO  SCREENING: Patient was assessed today using the SOZO machine to determine the lymphedema index score. This was compared to her baseline score. It was determined that she is NOT within the recommended range when compared to her baseline and so she was fitted for a compression garment while in the clinic today. It is recommended she return in 1 month to be reassessed. If she continues to measure outside the recommended range, physical therapy treatment will be recommended at that time and a referral requested.  L-DEX FLOWSHEETS - 06/21/24 1500       L-DEX LYMPHEDEMA SCREENING   Measurement Type Unilateral    L-DEX MEASUREMENT EXTREMITY Upper Extremity    POSITION  Standing    DOMINANT SIDE Right    At Risk Side Left    BASELINE SCORE (UNILATERAL) -10.8    L-DEX SCORE (UNILATERAL) -4.2    VALUE CHANGE (UNILAT) 6.6          P: Pt to return on 1 month for reassess due to high change from baseline indicating subclinical lymphedema.    Aden Berwyn Caldron, PTA 06/21/2024, 5:08 PM     PLEASE KEEP YOUR COMPRESSION GARMENT ON DURING THE DAY TO GET THE BEST SWELLING REDUCTION. HERE ARE SOME ADDITIONAL TIPS: Do not sleep in your garment. If you have pain or notice swelling in your hand or at the top of your shoulder, call your therapist. This may be a sign that you  need a different garment. 3.  Take good care of your garment so it lasts longer: Follow washing instructions on your garment label or box. Wash periodically using a mild detergent in warm water.  Do not use fabric softener or bleach.  Place garment in a mesh lingerie bag and use the gentle cycle of the washing machine or hand wash. Tumble dry low or lay flat to dry. TAKE CARE OF YOUR SKIN Apply a low pH moisturizing lotion to your skin daily Avoid scratching your skin Treat skin irritations quickly  Know the 5 warning signs of infection: redness, pain, warmth to touch, fever and increased swelling.  Call your physician  immediately if you notice any of these signs of a possible infection.   [facility]cer [facility] [phone]

## 2024-08-02 ENCOUNTER — Encounter: Payer: Self-pay | Admitting: Physical Therapy

## 2024-08-02 ENCOUNTER — Ambulatory Visit: Attending: General Surgery | Admitting: Physical Therapy

## 2024-08-02 DIAGNOSIS — Z483 Aftercare following surgery for neoplasm: Secondary | ICD-10-CM | POA: Insufficient documentation

## 2024-08-02 NOTE — Therapy (Signed)
  OUTPATIENT PHYSICAL THERAPY SOZO SCREENING NOTE   Patient Name: Jaime Pearson MRN: [id number] DOB:[birth year], 58 y.o., female Today's Date: 08/02/2024  PCP: Marget Lenis, MD REFERRING PROVIDER: Aron Shoulders, MD   PT End of Session - 08/02/24 1525     Visit Number 4    PT Start Time 1500    PT Stop Time 1520    PT Time Calculation (min) 20 min    Activity Tolerance Patient tolerated treatment well    Behavior During Therapy WFL for tasks assessed/performed          Past Medical History:  Diagnosis Date   Asthma    Breast cancer (HCC)    Depression    Family history of adverse reaction to anesthesia    mother prolonged time to wake up   Personal history of chemotherapy    Personal history of radiation therapy    Past Surgical History:  Procedure Laterality Date   BREAST LUMPECTOMY WITH RADIOACTIVE SEED AND SENTINEL LYMPH NODE BIOPSY Left 06/04/2022   Procedure: LEFT BREAST LUMPECTOMY WITH RADIOACTIVE SEED AND SENTINEL LYMPH NODE BIOPSY;  Surgeon: Aron Shoulders, MD;  Location: [facility];  Service: General;  Laterality: Left;   Patient Active Problem List   Diagnosis Date Noted   Genetic testing 06/03/2022   Family history of breast cancer 05/23/2022   Family history of prostate cancer 05/23/2022   Malignant neoplasm of upper-outer quadrant of left breast in female, estrogen receptor positive (HCC) 05/17/2022    REFERRING DIAG: left breast cancer at risk for lymphedema  THERAPY DIAG:  Aftercare following surgery for neoplasm  PERTINENT HISTORY: Patient was diagnosed on 04/30/2022 with left grade 3 invasive ductal carcinoma breast cancer. It measures 2.9 cm and is located in the upper outer quadrant. It is ER/PR positive and HER2 negative with a Ki67 of 40%.   PRECAUTIONS: left UE Lymphedema risk  SUBJECTIVE: Here for SOZO screen  PAIN:  Are you having pain? No  SOZO SCREENING: Patient was assessed today using the SOZO machine to  determine the lymphedema index score. This was compared to her baseline score. It was determined that she is within the recommended range when compared to her baseline and no further action is needed at this time. She will continue SOZO screenings. These are done every 3 months for 2 years post operatively followed by every 6 months for 2 years, and then annually.   L-DEX FLOWSHEETS - 08/02/24 1500       L-DEX LYMPHEDEMA SCREENING   Measurement Type Unilateral    L-DEX MEASUREMENT EXTREMITY Upper Extremity    POSITION  Standing    DOMINANT SIDE Right    At Risk Side Left    BASELINE SCORE (UNILATERAL) -10.8    L-DEX SCORE (UNILATERAL) -11.6    VALUE CHANGE (UNILAT) -0.8         Eward Wonda Sharps, [place] 08/02/24 3:28 PM

## 2025-01-31 ENCOUNTER — Ambulatory Visit: Admitting: Physical Therapy
